# Patient Record
Sex: Male | Born: 1965
Health system: Southern US, Community
[De-identification: ages and names within clinical notes are randomized; demographics above are authoritative.]

## PROBLEM LIST (undated history)

## (undated) DIAGNOSIS — E785 Hyperlipidemia, unspecified: Secondary | ICD-10-CM

## (undated) DIAGNOSIS — L4050 Arthropathic psoriasis, unspecified: Secondary | ICD-10-CM

## (undated) DIAGNOSIS — I214 Non-ST elevation (NSTEMI) myocardial infarction: Secondary | ICD-10-CM

## (undated) DIAGNOSIS — M539 Dorsopathy, unspecified: Secondary | ICD-10-CM

## (undated) DIAGNOSIS — L409 Psoriasis, unspecified: Secondary | ICD-10-CM

## (undated) DIAGNOSIS — F419 Anxiety disorder, unspecified: Secondary | ICD-10-CM

## (undated) DIAGNOSIS — I10 Essential (primary) hypertension: Secondary | ICD-10-CM

## (undated) HISTORY — DX: Anxiety disorder, unspecified: F41.9

## (undated) HISTORY — PX: APPENDECTOMY: SHX54

## (undated) HISTORY — DX: Psoriasis, unspecified: L40.9

## (undated) HISTORY — DX: Essential (primary) hypertension: I10

## (undated) HISTORY — DX: Hyperlipidemia, unspecified: E78.5

## (undated) HISTORY — PX: OTHER SURGICAL HISTORY: SHX169

## (undated) HISTORY — DX: Non-ST elevation (NSTEMI) myocardial infarction: I21.4

## (undated) HISTORY — DX: Arthropathic psoriasis, unspecified: L40.50

## (undated) HISTORY — PX: RETINAL DETACHMENT SURGERY: SHX105

## (undated) HISTORY — DX: Dorsopathy, unspecified: M53.9

---

## 1999-12-13 ENCOUNTER — Encounter: Admission: RE | Admit: 1999-12-13 | Discharge: 1999-12-13 | Payer: Self-pay | Admitting: Orthopedic Surgery

## 1999-12-13 ENCOUNTER — Encounter: Payer: Self-pay | Admitting: Orthopedic Surgery

## 2000-07-25 ENCOUNTER — Emergency Department (HOSPITAL_COMMUNITY): Admission: EM | Admit: 2000-07-25 | Discharge: 2000-07-25 | Payer: Self-pay | Admitting: Emergency Medicine

## 2000-07-26 ENCOUNTER — Inpatient Hospital Stay (HOSPITAL_COMMUNITY): Admission: EM | Admit: 2000-07-26 | Discharge: 2000-07-28 | Payer: Self-pay | Admitting: Emergency Medicine

## 2000-07-26 ENCOUNTER — Encounter: Payer: Self-pay | Admitting: General Surgery

## 2000-07-30 ENCOUNTER — Encounter: Payer: Self-pay | Admitting: Emergency Medicine

## 2000-07-30 ENCOUNTER — Emergency Department (HOSPITAL_COMMUNITY): Admission: EM | Admit: 2000-07-30 | Discharge: 2000-07-30 | Payer: Self-pay | Admitting: Emergency Medicine

## 2000-10-27 ENCOUNTER — Emergency Department (HOSPITAL_COMMUNITY): Admission: EM | Admit: 2000-10-27 | Discharge: 2000-10-27 | Payer: Self-pay | Admitting: Emergency Medicine

## 2003-08-16 ENCOUNTER — Emergency Department (HOSPITAL_COMMUNITY): Admission: AC | Admit: 2003-08-16 | Discharge: 2003-08-17 | Payer: Self-pay

## 2003-11-06 ENCOUNTER — Encounter: Admission: RE | Admit: 2003-11-06 | Discharge: 2003-11-06 | Payer: Self-pay | Admitting: Orthopedic Surgery

## 2004-02-01 ENCOUNTER — Inpatient Hospital Stay (HOSPITAL_COMMUNITY): Admission: RE | Admit: 2004-02-01 | Discharge: 2004-02-04 | Payer: Self-pay | Admitting: Orthopaedic Surgery

## 2005-07-17 ENCOUNTER — Ambulatory Visit: Payer: Self-pay | Admitting: Cardiovascular Disease

## 2005-07-18 ENCOUNTER — Emergency Department (HOSPITAL_COMMUNITY): Admission: EM | Admit: 2005-07-18 | Discharge: 2005-07-18 | Payer: Self-pay | Admitting: Emergency Medicine

## 2005-07-24 ENCOUNTER — Encounter: Payer: Self-pay | Admitting: Cardiology

## 2005-07-24 ENCOUNTER — Ambulatory Visit: Payer: Self-pay

## 2005-07-24 ENCOUNTER — Ambulatory Visit: Payer: Self-pay | Admitting: *Deleted

## 2006-02-05 ENCOUNTER — Emergency Department (HOSPITAL_COMMUNITY): Admission: EM | Admit: 2006-02-05 | Discharge: 2006-02-05 | Payer: Self-pay | Admitting: Emergency Medicine

## 2009-08-11 DIAGNOSIS — I214 Non-ST elevation (NSTEMI) myocardial infarction: Secondary | ICD-10-CM

## 2009-08-11 HISTORY — DX: Non-ST elevation (NSTEMI) myocardial infarction: I21.4

## 2009-09-04 ENCOUNTER — Inpatient Hospital Stay (HOSPITAL_COMMUNITY): Admission: EM | Admit: 2009-09-04 | Discharge: 2009-09-06 | Payer: Self-pay | Admitting: Emergency Medicine

## 2009-09-04 ENCOUNTER — Ambulatory Visit: Payer: Self-pay | Admitting: Cardiology

## 2009-09-10 DIAGNOSIS — I214 Non-ST elevation (NSTEMI) myocardial infarction: Secondary | ICD-10-CM

## 2009-09-10 DIAGNOSIS — R079 Chest pain, unspecified: Secondary | ICD-10-CM | POA: Insufficient documentation

## 2009-09-10 DIAGNOSIS — I10 Essential (primary) hypertension: Secondary | ICD-10-CM | POA: Insufficient documentation

## 2009-09-10 DIAGNOSIS — Z87442 Personal history of urinary calculi: Secondary | ICD-10-CM | POA: Insufficient documentation

## 2009-09-10 HISTORY — DX: Non-ST elevation (NSTEMI) myocardial infarction: I21.4

## 2009-09-13 ENCOUNTER — Encounter: Payer: Self-pay | Admitting: Cardiology

## 2009-09-17 ENCOUNTER — Encounter: Payer: Self-pay | Admitting: Cardiovascular Disease

## 2009-09-21 ENCOUNTER — Ambulatory Visit: Payer: Self-pay | Admitting: Cardiovascular Disease

## 2009-09-21 DIAGNOSIS — I2510 Atherosclerotic heart disease of native coronary artery without angina pectoris: Secondary | ICD-10-CM | POA: Insufficient documentation

## 2009-10-12 ENCOUNTER — Telehealth: Payer: Self-pay | Admitting: Cardiovascular Disease

## 2009-11-08 ENCOUNTER — Telehealth: Payer: Self-pay | Admitting: Cardiovascular Disease

## 2009-11-21 ENCOUNTER — Ambulatory Visit: Payer: Self-pay | Admitting: Cardiology

## 2009-11-21 ENCOUNTER — Ambulatory Visit (HOSPITAL_COMMUNITY): Admission: RE | Admit: 2009-11-21 | Discharge: 2009-11-21 | Payer: Self-pay | Admitting: Cardiovascular Disease

## 2009-11-21 ENCOUNTER — Encounter: Payer: Self-pay | Admitting: Cardiovascular Disease

## 2009-11-21 ENCOUNTER — Ambulatory Visit: Payer: Self-pay

## 2009-11-21 ENCOUNTER — Ambulatory Visit: Payer: Self-pay | Admitting: Cardiovascular Disease

## 2009-12-07 LAB — CONVERTED CEMR LAB
ALT: 31 units/L (ref 0–53)
Albumin: 4.1 g/dL (ref 3.5–5.2)
Bilirubin, Direct: 0 mg/dL (ref 0.0–0.3)
Cholesterol: 143 mg/dL (ref 0–200)
Direct LDL: 72.5 mg/dL
HDL: 43.5 mg/dL (ref >39.00)
Total Protein: 8.3 g/dL (ref 6.0–8.3)
Triglycerides: 230 mg/dL — ABNORMAL HIGH (ref 0.0–149.0)

## 2010-01-08 ENCOUNTER — Ambulatory Visit: Payer: Self-pay | Admitting: Cardiovascular Disease

## 2010-04-04 ENCOUNTER — Telehealth: Payer: Self-pay | Admitting: Cardiovascular Disease

## 2010-04-11 ENCOUNTER — Encounter: Payer: Self-pay | Admitting: Cardiovascular Disease

## 2010-08-09 ENCOUNTER — Ambulatory Visit: Payer: Self-pay | Admitting: Cardiovascular Disease

## 2010-08-09 ENCOUNTER — Encounter: Payer: Self-pay | Admitting: Cardiovascular Disease

## 2010-08-26 ENCOUNTER — Telehealth: Payer: Self-pay | Admitting: Cardiovascular Disease

## 2010-08-31 ENCOUNTER — Encounter: Payer: Self-pay | Admitting: Cardiovascular Disease

## 2010-09-10 NOTE — Progress Notes (Signed)
Summary: HEART RACING DUE TO MEDICATION NO( CHEST PAINS)  Phone Note Call from Patient Call back at [phone]   Caller: Patient Summary of Call: PT HEART RACING THINKS IT IS FROM MEDICATION (CRESTOR AND EFFIENT) Initial call taken by: Judie Grieve,  November 08, 2009 8:10 AM  Follow-up for Phone Call        Spoke with pt who reports daily episodes of heart beat pounding, feeling "edgy" and feels like he is having a "bad panic attack".  Pt able to exercise without difficulty and gets heart rate up to 140's with exercise.  No chest pain or SOB. States blood pressure and heart rate are fine at rest.  Taking all regularly prescribed medicines and takes Xanax infrequently.  He states he does drink coffee.  He reports alot of stress since death of brother last year. I told pt that symptoms do not sound as if they are coming from medicines and could possibly be anxiety related. I also encouraged him to decrease caffeine intake.  Pt expressed interest in starting cardiac rehab program so his exercise regimen would be monitored.  Will discuss with Dr. Clifton James. Dossie Arbour, RN, BSN  November 08, 2009 10:25 AM' Dr. Clifton James would like for pt to wear 48 hr holter monitor. Tried to call pt but no answer on number given and no voice mail. Home number listed is not in service. Will continue to try and reach him Dossie Arbour, RN, BSN  November 08, 2009 4:12 PM No answer on phone number given.  Left message at work number to have him call our office Dossie Arbour, RN, BSN  November 08, 2009 4:55 PM   Additional Follow-up for Phone Call Additional follow up Details #1::        pt returning call, Migdalia Dk  November 08, 2009 5:01 PM  Spoke with pt. He will come in for 48 hour holter monitor on 11/09/09 at 12:15 and follow up appt with Dr. Clifton James on 11/27/09 at 2:15 Additional Follow-up by: Dossie Arbour, RN, BSN,  November 08, 2009 5:23 PM

## 2010-09-10 NOTE — Progress Notes (Signed)
Summary: pt's meds making him feel funny  Phone Note Call from Patient Call back at Home Phone [phone]   Caller: Patient Reason for Call: Talk to Nurse, Talk to Doctor Summary of Call: pt was put on crestor and effient and he feels very antsy since he has been taking this and wants to talk to someone about it Initial call taken by: Omer Jack,  October 12, 2009 10:52 AM  Follow-up for Phone Call        Spoke with pt. Pt states he has been very anxious like feeling, restles like if he has drink can't get  when drinks coffee. Pt. denies drinking coffee. He would like to know if those symptoms are of Crestor of Effient side effects?.  Pt. has taken these meds for a month. After re-cheking the PDR I let pt. now those symptoms are not listed as those medications SE. RN advise pt to call his PCP for an appointment. Pt. Verbalized understanding. Follow-up by: Ollen Gross, RN, BSN,  October 12, 2009 12:52 PM

## 2010-09-10 NOTE — Assessment & Plan Note (Signed)
Summary: eph/jml   Visit Type:  EPH Primary Provider:  Louanna Raw  CC:  no cardiac complaints today.  History of Present Illness: 45 yo WM with history of HTN, recently diagnosed CAD at time of presentation with NSTEMi at [facility] 09/05/09. Cath revealed severe stenosis mid LAD, moderate stenosis RCA and OM3. 2 drug eluting stents were  placed in the mid LAD in an overlapping fashion. His EF was normal at time of cath. He did well following the cath and was discharged to home in stable condition. He is here today for follow up. He has returned to exercising daily and has had no chest pain or dyspnea. His cath site is well healed. He has had no pain or swelling at the cath site. He has reduced his atenolol to 25 mg per day because of mild fatigue and feels better on the reduced dose.   Current Medications (verified): 1)  Nitrostat 0.4 Mg Subl (Nitroglycerin) .Marland Kitchen.. 1 Tablet Under Tongue At Onset of Chest Pain; You May Repeat Every 5 Minutes For Up To 3 Doses. 2)  Crestor 40 Mg Tabs (Rosuvastatin Calcium) .Marland Kitchen.. 1 Tab  At Bedtime 3)  Aspirin Ec 325 Mg Tbec (Aspirin) .... Take One Tablet By Mouth Daily 4)  Effient 10 Mg Tabs (Prasugrel Hcl) .Marland Kitchen.. 1 Tab Once Daily 5)  Atenolol 50 Mg Tabs (Atenolol) .... 1/2 Tab Once Daily 6)  Alprazolam 0.5 Mg Tabs (Alprazolam) .Marland Kitchen.. 1 Tab Three Times A Day  Allergies: 1)  ! * Meperidine  Past History:  Past Medical History: NSTEMI January 2011 with 2 overlapping drug eluting stents mid LAD HYPERTENSION (ICD-401.9) NEPHROLITHIASIS, HX OF (ICD-V13.01) Chronic back problems Anxiety  Past Surgical History:  L5-S1 disk replacement Appendectomy Retinal detachment at age 69 (traumatic)  Family History: Mother died at age 80.  She had an MI in her 58s and   also has a history of hypertension with COPD. Father died at 45 and had an MI in his either late 60s or 65s.  He had a history hypertension and  COPD.  He has total 4 brothers.  One is a  recovering drug abuser.  The   other three have all had MIs in their late 30s or early 17s.  One died   at age 28 following CABG.  Twin sister with late stage lupus  Social History: The patient lives in [place] by himself. Divorced. Single He works a [employer].   He has never smoked.  He drinks 12  packs of beer week.   Denies drug use.   He exercises 5-7 days a week in  the gym and tries to adhere to a high-carb/high-protein diet.      Review of Systems  The patient denies fatigue, malaise, fever, weight gain/loss, vision loss, decreased hearing, hoarseness, chest pain, palpitations, shortness of breath, prolonged cough, wheezing, sleep apnea, coughing up blood, abdominal pain, blood in stool, nausea, vomiting, diarrhea, heartburn, incontinence, blood in urine, muscle weakness, joint pain, leg swelling, rash, skin lesions, headache, fainting, dizziness, depression, anxiety, enlarged lymph nodes, easy bruising or bleeding, and environmental allergies.    Vital Signs:  Patient profile:   45 year old male Height:      71 inches Weight:      243 pounds BMI:     34.01 Pulse rate:   66 / minute Pulse rhythm:   irregular BP sitting:   120 / 80  (left arm) Cuff size:   large  Vitals  Entered By: Danielle Rankin, CMA (September 21, 2009 10:54 AM)  Physical Exam  General:  General: Well developed, well nourished, NAD HEENT: OP clear, mucus membranes moist SKIN: warm, dry Neuro: No focal deficits Musculoskeletal: Muscle strength 5/5 all ext Psychiatric: Mood and affect normal Neck: No JVD, no carotid bruits, no thyromegaly, no lymphadenopathy. Lungs:Clear bilaterally, no wheezes, rhonci, crackles CV: RRR no murmurs, gallops rubs Abdomen: soft, NT, ND, BS present Extremities: No edema, pulses 2+.    Cardiac Cath  Procedure date:  09/05/2009  Findings:       1. The left main coronary artery was a very short segment that had no       disease.   2. The left  anterior descending was a large vessel that coursed to the       apex and gave off a moderate-sized first diagonal branch.  The LAD       had mild plaque disease in the proximal and distal portion.  The       midvessel just at the takeoff of the diagonal branch had a 65-75%       stenosis.  Just beyond this in the midvessel, there was a 99%       severe stenosis.  The first diagonal branch had an ostial 30%       stenosis.  This was a bifurcating branch.   3. Circumflex artery was a large caliber vessel that gave off two very       small obtuse marginal branches followed by a moderate-sized third       obtuse marginal branch that had a long tubular 50% stenosis in the       proximal portion.  The fourth obtuse marginal branch had mild       plaque disease.  The left-sided PDA had plaque disease.   4. The right coronary artery was a small-to-moderate-sized nondominant       vessel with serial 40-50% lesions in the proximal and midportion.   5. Left ventricular angiogram was performed in the RAO projection and       showed normal left ventricular systolic function with ejection       fraction of 55%.  There was a small area at the apex that appeared       to be mildly hypokinetic.      IMPRESSION:   1. Single-vessel coronary artery disease.   2. Moderately nonobstructive disease in the circumflex and right       coronary artery.   3. Normal left ventricular systolic function.   4. Successful percutaneous coronary intervention with placement of two       drug-eluting stents in an overlapping fashion in the mid left       anterior descending artery.   EKG  Procedure date:  09/21/2009  Findings:      NSR, rate 66 bpm. Poor R wave progression through precordial leads.   Impression & Recommendations:  Problem # 1:  CAD, NATIVE VESSEL (ICD-414.01) Stable post cath/stents. Continue ASA and Effient for one year. Continue low dose beta blocker and statin. I have encouraged continued  exercise and appropriate diet. He is a non smoker, lifelong.  Echo 10 weeks. Check LFTs and Lipids 10 weeks.   His updated medication list for this problem includes:    Nitrostat 0.4 Mg Subl (Nitroglycerin) .Marland Kitchen... 1 tablet under tongue at onset of chest pain; you may repeat every 5 minutes for up to 3 doses.    Aspirin  Ec 325 Mg Tbec (Aspirin) .Marland Kitchen... Take one tablet by mouth daily    Effient 10 Mg Tabs (Prasugrel hcl) .Marland Kitchen... 1 tab once daily    Atenolol 50 Mg Tabs (Atenolol) .Marland Kitchen... 1/2 tab once daily  Problem # 2:  HYPERTENSION (ICD-401.9) Well controlled on current therapy.   His updated medication list for this problem includes:    Aspirin Ec 325 Mg Tbec (Aspirin) .Marland Kitchen... Take one tablet by mouth daily    Atenolol 50 Mg Tabs (Atenolol) .Marland Kitchen... 1/2 tab once daily  Orders: Echocardiogram (Echo)  Other Orders: EKG w/ Interpretation (93000)  Patient Instructions: 1)  Your physician recommends that you schedule a follow-up appointment in: 6 months 2)  Your physician recommends that you return for a FASTING lipid profile and hepatic in 10 weeks -414.01,272.0   3)  Your physician has requested that you have an echocardiogram.  Echocardiography is a painless test that uses sound waves to create images of your heart. It provides your doctor with information about the size and shape of your heart and how well your heart's chambers and valves are working.  This procedure takes approximately one hour. There are no restrictions for this procedure.  To be done in 10 weeks

## 2010-09-10 NOTE — Procedures (Signed)
Summary: summary report  summary report   Imported By: Mirna Mires 11/22/2009 16:21:14  _____________________________________________________________________  External Attachment:    Type:   Image     Comment:   External Document  Appended Document: summary report pt. notified

## 2010-09-10 NOTE — Letter (Signed)
Summary: Generic Letter  [facility]     [place], [place]    Phone:   Fax:         April 11, 2010  [place] [address] [place], [place]  [address]  Dear John Esparza, We have been trying to contact you regarding a question you had called our office about. If you could please contact us if we could be of any further assistance to you at [phone]    Sincerely,  Ellender Hose RN  This letter has been electronically signed by your physician.

## 2010-09-10 NOTE — Assessment & Plan Note (Signed)
Summary: rov per pt call/lg   Visit Type:  rov Primary Provider:  Louanna Raw  CC:  no energy...joint pain.  History of Present Illness: 45 yo WM with history of HTN, recently diagnosed CAD at time of presentation with NSTEMi at [name] 09/05/09. Cath revealed severe stenosis mid LAD, moderate stenosis RCA and OM3. 2 drug eluting stents were  placed in the mid LAD in an overlapping fashion. His EF was normal at time of cath. He did well following the cath and was discharged to home in stable condition. He is here today for follow up. Prior to the last visit, he reduced his atenolol dose because of fatigue. Since I saw him, he has  had some palpitations. Holter monitor was ordered but he only wore it for about 30 minutes. He had some PVCs in that brief period. An echo showed normal LV size and function. He has been working out and having no chest pain. HIs breathing has been ok. Occasional right sided chest pains that last for a few seconds. He has been overall fatigued since being on the beta blocker. He has been lifting weights every day without limitation. He does have mild pain in both upper arms/elbows when lifting. He had not had this pain before he started the statin.   Current Medications (verified): 1)  Nitrostat 0.4 Mg Subl (Nitroglycerin) .Marland Kitchen.. 1 Tablet Under Tongue At Onset of Chest Pain; You May Repeat Every 5 Minutes For Up To 3 Doses. 2)  Crestor 40 Mg Tabs (Rosuvastatin Calcium) .Marland Kitchen.. 1 Tab  At Bedtime 3)  Effient 10 Mg Tabs (Prasugrel Hcl) .Marland Kitchen.. 1 Tab Once Daily 4)  Atenolol 50 Mg Tabs (Atenolol) .... 1/2 Tab Once Daily  Allergies: 1)  ! * Meperidine  Past History:  Past Medical History: Reviewed history from 09/21/2009 and no changes required. NSTEMI January 2011 with 2 overlapping drug eluting stents mid LAD HYPERTENSION (ICD-401.9) NEPHROLITHIASIS, HX OF (ICD-V13.01) Chronic back problems Anxiety  Social History: Reviewed history from 09/21/2009 and no  changes required. The patient lives in [place] by himself. Divorced. Single He works a [employer].   He has never smoked.  He drinks 12  packs of beer week.   Denies drug use.   He exercises 5-7 days a week in  the gym and tries to adhere to a high-carb/high-protein diet.      Review of Systems       The patient complains of fatigue, chest pain, and joint pain.  The patient denies malaise, fever, weight gain/loss, vision loss, decreased hearing, hoarseness, palpitations, shortness of breath, prolonged cough, wheezing, sleep apnea, coughing up blood, abdominal pain, blood in stool, nausea, vomiting, diarrhea, heartburn, incontinence, blood in urine, muscle weakness, leg swelling, rash, skin lesions, headache, fainting, dizziness, depression, anxiety, enlarged lymph nodes, easy bruising or bleeding, and environmental allergies.    Vital Signs:  Patient profile:   45 year old male Height:      71 inches Weight:      250 pounds BMI:     34.99 Pulse rate:   70 / minute Pulse rhythm:   regular BP sitting:   120 / 80  (left arm) Cuff size:   large  Vitals Entered By: Danielle Rankin, CMA (Jan 08, 2010 11:59 AM)  Physical Exam  General:  General: Well developed, well nourished, NAD Musculoskeletal: Muscle strength 5/5 all ext Psychiatric: Mood and affect normal Neck: No JVD, no carotid bruits, no thyromegaly, no lymphadenopathy. Lungs:Clear bilaterally,  no wheezes, rhonci, crackles CV: RRR no murmurs, gallops rubs Abdomen: soft, NT, ND, BS present Extremities: No edema, pulses 2+.    Echocardiogram  Procedure date:  11/21/2009  Findings:      Left ventricle: The cavity size was normal. Wall thickness was       increased in a pattern of mild LVH. Systolic function was normal.       The estimated ejection fraction was in the range of 55% to 60%.       Wall motion was normal; there were no regional wall motion       abnormalities. Left ventricular diastolic  function parameters were       normal.     - Pulmonary arteries: PA peak pressure: 34mm Hg (S).         Holter Monitor  Procedure date:  11/21/2009  Findings:      PVCs. NSR.   Impression & Recommendations:  Problem # 1:  CAD, NATIVE VESSEL (ICD-414.01) Stable. Will stop beta blocker secondary to fatigue. Continue ASA and Plavix. continue statin. will check CK level today with muscle aches.   The following medications were removed from the medication list:    Aspirin Ec 325 Mg Tbec (Aspirin) .Marland Kitchen... Take one tablet by mouth daily    Atenolol 50 Mg Tabs (Atenolol) .Marland Kitchen... 1/2 tab once daily His updated medication list for this problem includes:    Nitrostat 0.4 Mg Subl (Nitroglycerin) .Marland Kitchen... 1 tablet under tongue at onset of chest pain; you may repeat every 5 minutes for up to 3 doses.    Effient 10 Mg Tabs (Prasugrel hcl) .Marland Kitchen... 1 tab once daily    Aspirin 81 Mg Tbec (Aspirin) .Marland Kitchen... Take one tablet by mouth daily  Orders: TLB-CK Total Only(Creatine Kinase/CPK) (82550-CK)  Problem # 2:  HYPERTENSION (ICD-401.9) BP well controlled. Will reassess off of beta blocker.   The following medications were removed from the medication list:    Aspirin Ec 325 Mg Tbec (Aspirin) .Marland Kitchen... Take one tablet by mouth daily    Atenolol 50 Mg Tabs (Atenolol) .Marland Kitchen... 1/2 tab once daily His updated medication list for this problem includes:    Aspirin 81 Mg Tbec (Aspirin) .Marland Kitchen... Take one tablet by mouth daily  Patient Instructions: 1)  Your physician recommends that you schedule a follow-up appointment in: 6 months 2)  Your physician has recommended you make the following change in your medication: Start enteric coated aspirin 81 mg by mouth daily. 3)  Stop atenolol

## 2010-09-10 NOTE — Miscellaneous (Signed)
Summary: [facility]iac Progress Note  [facility] Progress Note   Imported By: Roderic Ovens 09/19/2009 12:23:20  _____________________________________________________________________  External Attachment:    Type:   Image     Comment:   External Document

## 2010-09-10 NOTE — Progress Notes (Signed)
Summary: Need a referral to a therapist-***no voice mail set up***  Phone Note Call from Patient Call back at Home Phone [phone]   Caller: Patient Summary of Call: Pt have question about getting referral to go to a therapist Initial call taken by: Judie Grieve,  April 04, 2010 2:00 PM  Follow-up for Phone Call        attempted to contact pt at [phone]--voice mailbox not set up yet Katina Dung, RN, BSN  April 04, 2010 2:53 PM  Attempted to contact pt. --Message received that voicemail has not been set up Dossie Arbour, RN, BSN  April 08, 2010 4:11 PM  Tried to call pt. back and voicemail has still not been set up. Will send a letter to try and further contact pt. Whitney Maeola Sarah RN  April 11, 2010 3:07 PM   Additional Follow-up for Phone Call Additional follow up Details #1::        I guess we can try him a few more times to see what he needs. Additional Follow-up by: Verne Carrow, MD,  April 11, 2010 1:27 PM

## 2010-09-10 NOTE — Miscellaneous (Signed)
Summary: [facility] Phys[facility] Order/Treatment Plan  [facility] Order/Treatment Plan   Imported By: Roderic Ovens 09/26/2009 10:54:11  _____________________________________________________________________  External Attachment:    Type:   Image     Comment:   External Document

## 2010-09-12 NOTE — Assessment & Plan Note (Signed)
Summary: E4V   Visit Type:  6 month follow up Primary Icess Bertoni:  Louanna Raw  CC:  no complaints.  History of Present Illness: 45 yo WM with history of HTN,  CAD at time of presentation with NSTEMI at [facility] 09/05/09. Cath revealed severe stenosis mid LAD, moderate stenosis RCA and OM3. 2 drug eluting stents were  placed in the mid LAD in an overlapping fashion. His EF was normal at time of cath.    He is here today for follow up. He has restarted his atenolol. Earlier this summer he had some palpitations. Holter monitor was ordered but he only wore it for about 30 minutes. He had some PVCs in that brief period. An echo showed normal LV size and function. He has been working out and having no chest pain. His breathing has been ok. He has been riding his bike with no chest pain.   Current Medications (verified): 1)  Nitrostat 0.4 Mg Subl (Nitroglycerin) .Marland Kitchen.. 1 Tablet Under Tongue At Onset of Chest Pain; You May Repeat Every 5 Minutes For Up To 3 Doses. 2)  Crestor 40 Mg Tabs (Rosuvastatin Calcium) .... 1/2  Tab At Bedtime 3)  Effient 10 Mg Tabs (Prasugrel Hcl) .Marland Kitchen.. 1 Tab Once Daily 4)  Aspirin 81 Mg Tbec (Aspirin) .... Take One Tablet By Mouth Daily 5)  Atenolol 50 Mg Tabs (Atenolol) .... Take One Half Tablet  By Mouth Daily  Allergies (verified): 1)  ! * Meperidine  Past History:  Past Medical History: Reviewed history from 09/21/2009 and no changes required. NSTEMI January 2011 with 2 overlapping drug eluting stents mid LAD HYPERTENSION (ICD-401.9) NEPHROLITHIASIS, HX OF (ICD-V13.01) Chronic back problems Anxiety  Social History: Reviewed history from 09/21/2009 and no changes required. The patient lives in [place] by himself. Divorced. Single He works a [employer].   He has never smoked.  He drinks 12  packs of beer week.   Denies drug use.   He exercises 5-7 days a week in  the gym and tries to adhere to a high-carb/high-protein diet.      Review of Systems  The patient denies fatigue, malaise, fever, weight gain/loss, vision loss, decreased hearing, hoarseness, chest pain, palpitations, shortness of breath, prolonged cough, wheezing, sleep apnea, coughing up blood, abdominal pain, blood in stool, nausea, vomiting, diarrhea, heartburn, incontinence, blood in urine, muscle weakness, joint pain, leg swelling, rash, skin lesions, headache, fainting, dizziness, depression, anxiety, enlarged lymph nodes, easy bruising or bleeding, and environmental allergies.    Vital Signs:  Patient profile:   45 year old male Height:      71 inches Weight:      252.50 pounds BMI:     35.34 Pulse rate:   68 / minute BP sitting:   128 / 88  (left arm) Cuff size:   large  Vitals Entered By: Caralee Ates CMA (August 09, 2010 3:33 PM)  Physical Exam  General:  General: Well developed, well nourished, NAD HEENT: OP clear, mucus membranes moist SKIN: warm, dry Neuro: No focal deficits Musculoskeletal: Muscle strength 5/5 all ext Psychiatric: Mood and affect normal Neck: No JVD, no carotid bruits, no thyromegaly, no lymphadenopathy. Lungs:Clear bilaterally, no wheezes, rhonci, crackles CV: RRR no murmurs, gallops rubs Abdomen: soft, NT, ND, BS present Extremities: No edema, pulses 2+.    EKG  Procedure date:  08/09/2010  Findings:      NSR, rate 68 bpm. Normal EKG.   Impression & Recommendations:  Problem # 1:  CAD, NATIVE VESSEL (ICD-414.01) Stable. No changes in medications. Continue statin, beta blocker, ASA and Effient.   His updated medication list for this problem includes:    Nitrostat 0.4 Mg Subl (Nitroglycerin) .Marland Kitchen... 1 tablet under tongue at onset of chest pain; you may repeat every 5 minutes for up to 3 doses.    Effient 10 Mg Tabs (Prasugrel hcl) .Marland Kitchen... 1 tab once daily    Aspirin 81 Mg Tbec (Aspirin) .Marland Kitchen... Take one tablet by mouth daily    Atenolol 50 Mg Tabs (Atenolol) .Marland Kitchen... Take one half tablet  by mouth  daily  Orders: EKG w/ Interpretation (93000)  Problem # 2:  HYPERTENSION (ICD-401.9) BP well controlled on current therapy.   His updated medication list for this problem includes:    Aspirin 81 Mg Tbec (Aspirin) .Marland Kitchen... Take one tablet by mouth daily    Atenolol 50 Mg Tabs (Atenolol) .Marland Kitchen... Take one half tablet  by mouth daily  Patient Instructions: 1)  Your physician recommends that you schedule a follow-up appointment in: 6 months with Dr. Clifton James 2)  Your physician recommends that you continue on your current medications as directed. Please refer to the Current Medication list given to you today. Prescriptions: EFFIENT 10 MG TABS (PRASUGREL HCL) 1 tab once daily  #90 x 03   Entered by:   Lisabeth Devoid RN   Authorized by:   Verne Carrow, MD   Signed by:   Lisabeth Devoid RN on 08/09/2010   Method used:   Electronically to        [facility]  [phone]* (retail)       [address]       [place], [place]  [address]       Ph: [phone] or [phone]       Fax: [phone]   RxID:   [id number] CRESTOR 40 MG TABS (ROSUVASTATIN CALCIUM) 1 tab  at bedtime  #90 Each x 3   Entered by:   Lisabeth Devoid RN   Authorized by:   Verne Carrow, MD   Signed by:   Lisabeth Devoid RN on 08/09/2010   Method used:   Electronically to        [facility]  [phone]* (retail)       [address]       [place], [place]  [address]       Ph: [phone] or [phone]       Fax: [phone]   RxID:   [id number]

## 2010-09-12 NOTE — Progress Notes (Signed)
Summary: rx NTG  Phone Note Refill Request Message from:  Patient on August 26, 2010 11:08 AM  Refills Requested: Medication #1:  NITROSTAT 0.4 MG SUBL 1 tablet under tongue at onset of chest pain; you may repeat every 5 minutes for up to 3 doses.  Method Requested: Telephone to Pharmacy Initial call taken by: Roe Coombs,  August 26, 2010 11:08 AM    Prescriptions: NITROSTAT 0.4 MG SUBL (NITROGLYCERIN) 1 tablet under tongue at onset of chest pain; you may repeat every 5 minutes for up to 3 doses.  #25 x 6   Entered by:   Danielle Rankin, CMA   Authorized by:   Verne Carrow, MD   Signed by:   Danielle Rankin, CMA on 08/26/2010   Method used:   Electronically to        [facility]  [phone]* (retail)       [address]       [place], [place]  [address]       Ph: [phone] or [phone]       Fax: [phone]   RxID:   [id number]

## 2010-10-27 LAB — POCT CARDIAC MARKERS
CKMB, poc: 3.9 ng/mL (ref 1.0–8.0)
CKMB, poc: 5.4 ng/mL (ref 1.0–8.0)
Troponin i, poc: 0.08 ng/mL (ref 0.00–0.09)
Troponin i, poc: 0.16 ng/mL — ABNORMAL HIGH (ref 0.00–0.09)
Troponin i, poc: <0.05 ng/mL (ref 0.00–0.09)

## 2010-10-27 LAB — COMPREHENSIVE METABOLIC PANEL
Albumin: 3.4 g/dL — ABNORMAL LOW (ref 3.5–5.2)
BUN: 11 mg/dL (ref 6–23)
Calcium: 8.8 mg/dL (ref 8.4–10.5)
Chloride: 102 mEq/L (ref 96–112)
Creatinine, Ser: 1.06 mg/dL (ref 0.4–1.5)
GFR calc Af Amer: >60 mL/min (ref >60)
Total Bilirubin: 0.7 mg/dL (ref 0.3–1.2)

## 2010-10-27 LAB — CBC
HCT: 44.6 % (ref 39.0–52.0)
HCT: 45.2 % (ref 39.0–52.0)
Hemoglobin: 14.8 g/dL (ref 13.0–17.0)
Hemoglobin: 15.9 g/dL (ref 13.0–17.0)
MCHC: 35 g/dL (ref 30.0–36.0)
MCV: 91.7 fL (ref 78.0–100.0)
MCV: 92.6 fL (ref 78.0–100.0)
Platelets: 212 10*3/uL (ref 150–400)
Platelets: 218 10*3/uL (ref 150–400)
RBC: 4.59 MIL/uL (ref 4.22–5.81)
RBC: 4.81 MIL/uL (ref 4.22–5.81)
RDW: 12.8 % (ref 11.5–15.5)
RDW: 12.8 % (ref 11.5–15.5)
WBC: 9.1 10*3/uL (ref 4.0–10.5)
WBC: 9.6 10*3/uL (ref 4.0–10.5)
WBC: 9.8 10*3/uL (ref 4.0–10.5)

## 2010-10-27 LAB — BASIC METABOLIC PANEL
BUN: 12 mg/dL (ref 6–23)
BUN: 13 mg/dL (ref 6–23)
CO2: 25 mEq/L (ref 19–32)
CO2: 30 mEq/L (ref 19–32)
Chloride: 103 mEq/L (ref 96–112)
Chloride: 104 mEq/L (ref 96–112)
GFR calc Af Amer: >60 mL/min (ref >60)
Glucose, Bld: 93 mg/dL (ref 70–99)
Potassium: 3.9 mEq/L (ref 3.5–5.1)
Potassium: 4 mEq/L (ref 3.5–5.1)
Sodium: 137 mEq/L (ref 135–145)

## 2010-10-27 LAB — DIFFERENTIAL
Basophils Absolute: 0.1 10*3/uL (ref 0.0–0.1)
Eosinophils Absolute: 0.2 10*3/uL (ref 0.0–0.7)
Eosinophils Relative: 3 % (ref 0–5)
Lymphocytes Relative: 37 % (ref 12–46)
Monocytes Absolute: 0.9 10*3/uL (ref 0.1–1.0)

## 2010-10-27 LAB — CK TOTAL AND CKMB (NOT AT ARMC)
Relative Index: 3.1 — ABNORMAL HIGH (ref 0.0–2.5)
Total CK: 405 U/L — ABNORMAL HIGH (ref 7–232)

## 2010-10-27 LAB — LIPID PANEL
HDL: 40 mg/dL (ref >39)
LDL Cholesterol: 143 mg/dL — ABNORMAL HIGH (ref 0–99)
Triglycerides: 181 mg/dL — ABNORMAL HIGH (ref <150)
VLDL: 36 mg/dL (ref 0–40)

## 2010-10-27 LAB — CARDIAC PANEL(CRET KIN+CKTOT+MB+TROPI)
CK, MB: 12.2 ng/mL (ref 0.3–4.0)
CK, MB: 7.1 ng/mL (ref 0.3–4.0)
Relative Index: 2.4 (ref 0.0–2.5)
Relative Index: 2.9 — ABNORMAL HIGH (ref 0.0–2.5)
Relative Index: 3.3 — ABNORMAL HIGH (ref 0.0–2.5)
Total CK: 243 U/L — ABNORMAL HIGH (ref 7–232)
Total CK: 365 U/L — ABNORMAL HIGH (ref 7–232)
Troponin I: 1.81 ng/mL (ref 0.00–0.06)
Troponin I: 2.73 ng/mL (ref 0.00–0.06)

## 2010-10-27 LAB — HEPARIN LEVEL (UNFRACTIONATED)
Heparin Unfractionated: 0.12 IU/mL — ABNORMAL LOW (ref 0.30–0.70)
Heparin Unfractionated: 0.19 IU/mL — ABNORMAL LOW (ref 0.30–0.70)

## 2010-10-27 LAB — TROPONIN I: Troponin I: 1.3 ng/mL (ref 0.00–0.06)

## 2010-10-27 LAB — TSH: TSH: 1.125 u[IU]/mL (ref 0.350–4.500)

## 2010-10-27 LAB — APTT: aPTT: 27 seconds (ref 24–37)

## 2010-12-27 NOTE — Op Note (Signed)
NAME:  John Esparza, HACKENBERG                           ACCOUNT NO.:  [id number]   MEDICAL RECORD NO.:  [id number]                   PATIENT TYPE:  INP   LOCATION:  6705                                 FACILITY:  [facility]   PHYSICIAN:  Adolph Pollack, M.D.            DATE OF BIRTH:  08-23-1965   DATE OF PROCEDURE:  02/01/2004  DATE OF DISCHARGE:  02/04/2004                                 OPERATIVE REPORT   PREOPERATIVE DIAGNOSIS:  L5-S1 degenerative disk disease with posterior  herniation.   POSTOPERATIVE DIAGNOSIS:  L5-S1 degenerative disk disease with posterior  herniation.   OPERATION PERFORMED:  Anterior preperitoneal approach to lumbosacral spine.   SURGEON:  Adolph Pollack, M.D.   ASSISTANT:  Velora Heckler, M.D.   ANESTHESIA:  General.   INDICATIONS FOR PROCEDURE:  This 45 year old male is a patient of Dr. Sharolyn Douglas who had an injury and work relating to the above diagnosis.  He has  tried conservative measures but continues to have problems with pain and now  presents for disk arthroplasty by way of an anterior approach.   DESCRIPTION OF PROCEDURE:  He was seen in the holding area, brought to the  operating room and placed supine on the operating table and a general  anesthetic was administered.  Foley catheter was placed in the bladder.  The  abdominal wall was clipped and sterilely prepped and draped.  A paramedian  or midline incision was made through the skin and subcutaneous tissue  and  anterior rectus sheath on the left.  The left rectus muscles were retracted  medially.  Using blunt dissection, extraperitoneal tissues were mobilized  medially including the ureter exposing the left iliac vein and artery.  I  then exposed the L5-S1 level and used careful dissection and cautery to  expose the L5-S1 disk space and L5 itself.  Once I had adequate exposure of  this, Dr. Noel Gerold proceeded with the L5-S1 disk arthroplasty and I helped him  with exposure on that.   After  the completion of Dr. Wells Guiles procedure, which he dictated in a  separate note, I then examined the area.  Hemostasis was noted to be  adequate.  The extraperitoneal structures were allowed to fall back down  into their normal position.  No vascular injury or ureteral injury was  noted.  Once the counts were confirmed, I then closed the anterior rectus  sheath with a running #1 PDS suture.  The subcutaneous tissue was irrigated  and the skin closed with staples.  I checked his pedal pulses on the left  side.  He had strong pulse present in both the dorsalis pedis and posterior  tibial region.  The patient tolerated the procedure without apparent  complications.  He subsequently was taken to recovery room in satisfactory  condition.  Adolph Pollack, M.D.    Kari Baars  D:  02/05/2004  T:  02/05/2004  Job:  [id number]

## 2010-12-27 NOTE — Discharge Summary (Signed)
NAME:  John Esparza, FOTI NO.:  [id number]   MEDICAL RECORD NO.:  [id number]          PATIENT TYPE:  INP   LOCATION:  1843                         FACILITY:  [facility]   PHYSICIAN:  Jesse Sans. Wall, M.D.   DATE OF BIRTH:  July 01, 1966   DATE OF ADMISSION:  07/18/2005  DATE OF DISCHARGE:  07/18/2005                                 DISCHARGE SUMMARY   DISCHARGE DIAGNOSIS:  Palpitations, point of care markers x 1 set of  negative cardiac enzymes with electrocardiogram showing normal sinus rhythm  with nonspecific ST-T wave changes, point of care enzymes negative except  for elevated myoglobin.   PAST MEDICAL HISTORY:  Chronic back problems.   Mr. Fayson was seen in [facility] Emergency Department by Dr. Maurine Cane on  the day of admission complaining of acute onset of fullness in his neck and  flushing of his face with some mild swelling around his lips with  palpitations and an episode of vomiting after arrival to the emergency room.  The patient lifts weights and has been taking a lot of vitamins, also had  drank a new type of beer for him on the previous evening.  His symptoms  actually began about an hour after drinking a beer.  Mr. Janes was treated  with Benadryl and Solu-Medrol, aspirin, and nitroglycerin in the emergency  room  with complete resolution of neck and facial flushing.  Mr. Mcdermid  denied any chest pain.  However, Mr. Biss has a very strong family history  for premature coronary artery disease with two brothers having an MI in  their late 69s, one of these brothers already having bypass surgery.  Initially, Mr. Rao was going to be admitted to rule out MI, however, he  expressed interest in being discharged home and follow up as an outpatient.  Dr. Daleen Squibb into speak with the patient and review patient's chart.  The  patient has had no further episodes of palpitations.  It has been arranged  for him to be discharged home, follow up at our office on Monday.   I  scheduled him for a gait and Myoview at 7:15 a.m. on Monday, December 11,  and a 2D echocardiogram at 12:15 secondary to Dr. Fabio Bering finding of a soft  systolic murmur.  In the meantime, the patient is to call 911 if he  experiences any further episodes of discomfort that brought him in.  He is  also to have Benadryl p.r.n. if needed.  We will also check a fasting lipid  panel Monday at our office.  Duration of discharge 20 minutes.      Dorian Pod, NP      Jesse Sans. Wall, M.D.  Electronically Signed    MB/MEDQ  D:  07/18/2005  T:  07/18/2005  Job:  [id number]   cc:   Charlton Haws, M.D.  [address]  [address]  [place]  [place] [address]

## 2010-12-27 NOTE — Discharge Summary (Signed)
NAME:  John Esparza, John Esparza NO.:  [id number]   MEDICAL RECORD NO.:  [id number]                   PATIENT TYPE:  INP   LOCATION:  6705                                 FACILITY:  [facility]   PHYSICIAN:  Sharolyn Douglas, M.D.                     DATE OF BIRTH:  [birth year]   DATE OF ADMISSION:  02/01/2004  DATE OF DISCHARGE:  02/04/2004                                 DISCHARGE SUMMARY   ADMISSION DIAGNOSES:  1. L5-S1 degenerative disk disease with chronic pain.  2. Hypertension.   DISCHARGE DIAGNOSES:  1. Status post L5-S1 disk replacement.  2. Postoperative fever resolved prior to discharge.  3. Hypertension.  4. Mild postoperative anemia.   CONSULTATIONS:  None.   PROCEDURE:  On February 01, 2004, the patient was taken to the operating room  for an L5-S1 disk replacement.  This was done by Sharolyn Douglas, M.D. with the  assistance of [facility].  Anterior exposure was obtained by Avel Peace, M.D. with the assistance of Darnell Level, M.D.  General  anesthesia was used.   LABORATORY DATA:  On January 26, 2004, CBC with differential was within normal  limits.  PT/INR and PTT was within normal limits.  Complete metabolic panel  within normal limits.  UA was negative.  Blood type __________ positive,  antibody screen negative.  CBC was monitored for 2 days postoperatively.  Did have elevation in his white count of 21.3 and 14.3.  H&H was monitored  throughout his hospital stay.  He did drop down to a low of 12.9 and 37.0  but he was asymptomatic and did not require any treatment.  On June 23rd  basic metabolic panel:  Sodium was 133, glucose 198, calcium 8.2, otherwise  normal.  The following day on June 24th sodium continued to be 133, chloride  94, glucose 121, otherwise normal.  EKG from June 17th shows normal sinus  rhythm read by Daisey Must, M.D.  X-ray was used intraoperatively for  positioning and localization.  On June 23rd, lumbar spine showed a  synthetic  disk replacement at L5-S1.   HOSPITAL COURSE:  The patient is a 45 year old male who was taken to the  operating room for above-mentioned procedure on February 01, 2004.  Tolerated  the procedure well without any intraoperative complications.  Approximately  400 mL of blood loss.  He was transferred to the recovery room in stable  condition.  Postoperatively a routine orthopedic spine protocol was followed  including prophylactic antibiotics, early mobilization, pain control using a  combination of PCA and p.o. analgesics.  Diet was slowly advanced  postoperatively.  Physical therapy and occupational therapy were consulted  to assist [place] with early mobilization and back precautions including  encouraging flexion and discouraging extension beyond neutral position.  During his hospital stay he did progress very well with physical therapy.  He  did not develop any postoperative complications.  He only had a low-grade  temperature which did resolve with mobilization and incentive spirometer  use.  General surgery, Dr. Abbey Chatters, continued to follow him with [place]  throughout his hospital stay.  By February 04, 2004, the patient had met all  orthopedic goals.  He was medically stable and ready for discharge.  He was  stable and ready for discharge from a general surgery standpoint as well.   DISCHARGE PLAN:  Includes diagnosis.  The patient is a 45 year old male  status post L5-S1 disk replacement doing well.  Activity is daily  ambulation.  We encourage flexion in sitting position. We discourage  extension beyond neutral.  Avoid any heavy lifting.  Avoid repetitive  bending, stooping, squatting.  Diet is regular home diet.   FOLLOW UP:  Two weeks postoperatively with Dr. Noel Gerold, also follow up with  Dr. Abbey Chatters.   CONDITION ON DISCHARGE:  Stable, improved.   DISPOSITION:  The patient is being discharged to his home with his family's  assistance.   DISCHARGE MEDICATIONS:  1. Percocet  p.r.n.  2. Skelaxin p.r.n.  3. Multivitamin daily.  4. Calcium daily.  5. Laxative as needed.  6. Avoid NSAIDs for 3 months.      Verlin Fester, P.A.                       Sharolyn Douglas, M.D.    CM/MEDQ  D:  02/28/2004  T:  02/28/2004  Job:  [id number]
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
## 2010-12-27 NOTE — Op Note (Signed)
NAME:  John Esparza, John Esparza NO.:  [id number]   MEDICAL RECORD NO.:  [id number]                   PATIENT TYPE:  INP   LOCATION:  6705                                 FACILITY:  [facility]   PHYSICIAN:  Sharolyn Douglas, M.D.                     DATE OF BIRTH:  [birth year]   DATE OF PROCEDURE:  02/01/2004  DATE OF DISCHARGE:  02/04/2004                                 OPERATIVE REPORT   PREOPERATIVE DIAGNOSIS:  Degenerative disk disease L5-S1.   POSTOPERATIVE DIAGNOSIS:  Degenerative disk disease L5-S1.   OPERATION PERFORMED:  L5-S1 disk replacement utilizing the Charite disk  replacement prosthesis.   SURGEON:  Sharolyn Douglas, M.D.   ASSISTANT:  Verlin Fester, P.A.   ANESTHESIA:  General endotracheal.   EXPOSURE SURGEON:  Adolph Pollack, M.D.   COMPLICATIONS:  None.   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  He has been refractory to  all conservative options.  His discogram was concordant at L5-S1 with normal  controls at the cephalad levels.  He has elected to undergo disk replacement  in hopes of improving his symptoms.  I have extensively discussed with him  the pros and cons of a disk replacement versus spinal fusions.  He knows the  risks involved.  He was given literature and has done his own  investigations.  He elected to proceed.   DESCRIPTION OF PROCEDURE:  The patient was properly identified in the  holding area and taken to the operating room.  He underwent general  endotracheal anesthesia without difficulty.  He was given prophylactic IV  antibiotics.  He was carefully position on the radiolucent operating room  table with the L5-S1 level placed at the break in the table so that the  spine could be flexed and extended as necessary for access to the  interspace.  All bony prominences were padded.   Face and eyes were protected  at all times.  Back prepped and draped in the usual sterile fashion.  Dr.  Abbey Chatters performed a retroperitoneal approach to the L5-S1 interspace.  He  then scrubbed out and [facility] [place] and myself proceeded to carry out  the disk replacement operation at L5-S1.  We identified the level using  fluoroscopy.  We carefully identified the iliac vessels and ureter.  These  structures were protected at all times using hand held retractors.  Sharp  annulotomy was carried out.  A radical diskectomy was carried back to the  posterior longitudinal ligament.  Great care was taken to protect the  subchondral bone.  The cartilaginous end plates were removed.  The posterior  lateral recess was removed of all disk material.  The posterior longitudinal  ligament was released using a curet.  Posterior  osteophytes were identified  off the end plate of S1 posteriorly and these were taken down using the  curet.  Distractors were used to sequentially dilate the disk space.  Supporting the prosthesis.  The various trials were then placed in the disk  space and checked with AP and lateral fluoroscopy.  We selected a size 3 for  this patient.  We were limited in the anterior posterior extent.  The  anterior margin of the sacrum was sloped making it impossible to place a  larger implant.  We then selected a 5 mm lordotic trial and tamped it into  the interspace and posteriorly.  AP and lateral fluoroscopy was utilized to  position the trial implant precisely in the middle on the anterior posterior  view and all the way posteriorly on the lateral view.  The 5 mm lordotic  trial had an excellent interference fit and this was felt to be the most  appropriate lordotic angle for this patient.  A marking pen was placed for a  midline reference point. The trial was removed and the #3 pilot driver was  impacted into the interspace and to the posterior margin of the vertebral  end plate  which was confirmed with lateral fluoroscopy.  The pilot driver  was then removed and the implant loaded onto the insertion driver.  The 5 mm  lordosis was placed inferiorly.  The implant was impacted into position  using the midline marking screw as reference.  Lateral fluoroscopy was used  to confirm that the prosthesis was placed as far posteriorly as possible.  We then distracted the disk space and the 8.5 mm poly insert was inserted.  The distraction was removed.  The handle was removed from the prosthesis.  We had an excellent interference fit.  The wound was irrigated.  Dr.  Abbey Chatters then scrubbed back in.  The midline marking screw was removed.  The iliac vessels were inspected.  There was no bleeding.  The ureter was  also evaluated and found to be intact.  Dr. Abbey Chatters then closed the wound  in layers.  Skin staples were used on the skin.  Sterile dressing applied.  The patient was extubated without difficulty.  He had good pulses distally  and was able to move his upper and lower extremities when he was transported  to the recovery room in stable condition.                                               Sharolyn Douglas, M.D.    MC/MEDQ  D:  02/04/2004  T:  02/05/2004  Job:  [id number]
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
## 2011-06-30 ENCOUNTER — Encounter: Payer: Self-pay | Admitting: Cardiovascular Disease

## 2011-07-02 ENCOUNTER — Ambulatory Visit: Payer: Self-pay | Admitting: Cardiovascular Disease

## 2011-10-10 ENCOUNTER — Other Ambulatory Visit: Payer: Self-pay | Admitting: Cardiovascular Disease

## 2012-04-11 ENCOUNTER — Encounter (HOSPITAL_COMMUNITY): Payer: Self-pay | Admitting: *Deleted

## 2012-04-11 ENCOUNTER — Emergency Department (HOSPITAL_COMMUNITY)
Admission: EM | Admit: 2012-04-11 | Discharge: 2012-04-11 | Disposition: A | Discharge status: Home / Self Care | Payer: Worker's Compensation | Attending: Emergency Medicine | Admitting: Emergency Medicine

## 2012-04-11 DIAGNOSIS — Z7721 Contact with and (suspected) exposure to potentially hazardous body fluids: Secondary | ICD-10-CM | POA: Insufficient documentation

## 2012-04-11 DIAGNOSIS — Z7982 Long term (current) use of aspirin: Secondary | ICD-10-CM | POA: Insufficient documentation

## 2012-04-11 DIAGNOSIS — Z20828 Contact with and (suspected) exposure to other viral communicable diseases: Secondary | ICD-10-CM | POA: Insufficient documentation

## 2012-04-11 DIAGNOSIS — I10 Essential (primary) hypertension: Secondary | ICD-10-CM | POA: Insufficient documentation

## 2012-04-11 DIAGNOSIS — Z23 Encounter for immunization: Secondary | ICD-10-CM | POA: Insufficient documentation

## 2012-04-11 DIAGNOSIS — Z206 Contact with and (suspected) exposure to human immunodeficiency virus [HIV]: Secondary | ICD-10-CM

## 2012-04-11 DIAGNOSIS — Z9089 Acquired absence of other organs: Secondary | ICD-10-CM | POA: Insufficient documentation

## 2012-04-11 LAB — URINALYSIS, ROUTINE W REFLEX MICROSCOPIC
Bilirubin Urine: NEGATIVE
Glucose, UA: NEGATIVE mg/dL
Hgb urine dipstick: NEGATIVE
Ketones, ur: NEGATIVE mg/dL
Leukocytes, UA: NEGATIVE
Nitrite: NEGATIVE
Protein, ur: NEGATIVE mg/dL
Specific Gravity, Urine: 1.022 (ref 1.005–1.030)
Urobilinogen, UA: 0.2 mg/dL (ref 0.0–1.0)
pH: 6 (ref 5.0–8.0)

## 2012-04-11 LAB — COMPREHENSIVE METABOLIC PANEL WITH GFR
ALT: 47 U/L (ref 0–53)
AST: 30 U/L (ref 0–37)
Albumin: 4.2 g/dL (ref 3.5–5.2)
Alkaline Phosphatase: 65 U/L (ref 39–117)
BUN: 10 mg/dL (ref 6–23)
CO2: 29 meq/L (ref 19–32)
Calcium: 10 mg/dL (ref 8.4–10.5)
Chloride: 100 meq/L (ref 96–112)
Creatinine, Ser: 1.07 mg/dL (ref 0.50–1.35)
GFR calc Af Amer: >90 mL/min
GFR calc non Af Amer: 82 mL/min — ABNORMAL LOW
Glucose, Bld: 100 mg/dL — ABNORMAL HIGH (ref 70–99)
Potassium: 4.1 meq/L (ref 3.5–5.1)
Sodium: 139 meq/L (ref 135–145)
Total Bilirubin: 0.2 mg/dL — ABNORMAL LOW (ref 0.3–1.2)
Total Protein: 7.9 g/dL (ref 6.0–8.3)

## 2012-04-11 LAB — CBC WITH DIFFERENTIAL/PLATELET
Basophils Absolute: 0.1 10*3/uL (ref 0.0–0.1)
Basophils Relative: 1 % (ref 0–1)
Eosinophils Absolute: 0.2 10*3/uL (ref 0.0–0.7)
MCH: 32.4 pg (ref 26.0–34.0)
MCHC: 35.3 g/dL (ref 30.0–36.0)
Neutrophils Relative %: 53 % (ref 43–77)
Platelets: 199 10*3/uL (ref 150–400)
RBC: 5 MIL/uL (ref 4.22–5.81)
RDW: 12.2 % (ref 11.5–15.5)

## 2012-04-11 LAB — AMYLASE: Amylase: 32 U/L (ref 0–105)

## 2012-04-11 LAB — HIV RAPID SCREEN (BLD OR BODY FLD EXPOSURE): Rapid HIV Screen: NONREACTIVE

## 2012-04-11 LAB — HEPATITIS B SURFACE ANTIGEN: Hepatitis B Surface Ag: NEGATIVE

## 2012-04-11 MED ORDER — LOPINAVIR-RITONAVIR 200-50 MG PO TABS: 2.0000 | ORAL_TABLET | Freq: Two times a day (BID) | ORAL | Status: DC

## 2012-04-11 MED ORDER — EMTRICITABINE-TENOFOVIR DF 200-300 MG PO TABS: 1.0000 | ORAL_TABLET | Freq: Every day | ORAL | Status: DC

## 2012-04-11 MED ORDER — TETANUS-DIPHTH-ACELL PERTUSSIS 5-2.5-18.5 LF-MCG/0.5 IM SUSP
0.5000 mL | Freq: Once | INTRAMUSCULAR | Status: AC
  Administered 2012-04-11: 0.5 mL via INTRAMUSCULAR
  Filled 2012-04-11: qty 0.5

## 2012-04-11 MED ORDER — LOPINAVIR-RITONAVIR 200-50 MG PO TABS
2.0000 | ORAL_TABLET | Freq: Once | ORAL | Status: AC
  Administered 2012-04-11: 2 via ORAL
  Filled 2012-04-11: qty 2

## 2012-04-11 MED ORDER — EMTRICITABINE-TENOFOVIR DF 200-300 MG PO TABS
1.0000 | ORAL_TABLET | Freq: Every day | ORAL | Status: DC
  Administered 2012-04-11: 1 via ORAL
  Filled 2012-04-11: qty 1
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
## 2012-04-11 NOTE — ED Notes (Signed)
Lab tech did not draw correct tubes when she drew labs on pt earlier in stay. Pt was discharged and this RN called pt to have to return to have needed labs drawn per blood exposure protocol. Pt was able to return and needed labs were drawn by this RN.
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
## 2012-05-14 ENCOUNTER — Ambulatory Visit (INDEPENDENT_AMBULATORY_CARE_PROVIDER_SITE_OTHER): Payer: 59 | Admitting: Family Medicine

## 2012-05-14 ENCOUNTER — Ambulatory Visit: Payer: 59

## 2012-05-14 VITALS — BP 158/90 | HR 100 | Temp 98.2°F | Resp 16 | Ht 71.0 in | Wt 260.0 lb

## 2012-05-14 DIAGNOSIS — M79609 Pain in unspecified limb: Secondary | ICD-10-CM

## 2012-05-14 DIAGNOSIS — M542 Cervicalgia: Secondary | ICD-10-CM

## 2012-05-14 DIAGNOSIS — F419 Anxiety disorder, unspecified: Secondary | ICD-10-CM

## 2012-05-14 DIAGNOSIS — M79603 Pain in arm, unspecified: Secondary | ICD-10-CM

## 2012-05-14 DIAGNOSIS — I219 Acute myocardial infarction, unspecified: Secondary | ICD-10-CM

## 2012-05-14 DIAGNOSIS — F411 Generalized anxiety disorder: Secondary | ICD-10-CM

## 2012-05-14 DIAGNOSIS — M545 Low back pain, unspecified: Secondary | ICD-10-CM

## 2012-05-14 MED ORDER — CYCLOBENZAPRINE HCL 10 MG PO TABS: 10.0000 mg | ORAL_TABLET | Freq: Two times a day (BID) | ORAL | Status: DC | PRN

## 2012-05-14 MED ORDER — ALPRAZOLAM 0.5 MG PO TABS: 0.5000 mg | ORAL_TABLET | Freq: Three times a day (TID) | ORAL | Status: DC | PRN

## 2012-05-14 MED ORDER — ATENOLOL 50 MG PO TABS: 25.0000 mg | ORAL_TABLET | Freq: Every day | ORAL | Status: DC

## 2012-05-14 MED ORDER — ROSUVASTATIN CALCIUM 20 MG PO TABS: 20.0000 mg | ORAL_TABLET | Freq: Every day | ORAL | Status: DC

## 2012-05-14 MED ORDER — PREDNISONE 20 MG PO TABS: ORAL_TABLET | ORAL | Status: DC

## 2012-05-14 NOTE — Patient Instructions (Addendum)
Let me know if your arm/ shoulder is not better in the next few days.  In that case I will refer you to a sports med specialist.    Start back on your BP and cholesterol meds!    Take care
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
## 2012-05-18 ENCOUNTER — Encounter: Payer: Self-pay | Admitting: Family Medicine

## 2013-06-06 ENCOUNTER — Other Ambulatory Visit: Payer: Self-pay | Admitting: Family Medicine

## 2014-02-12 ENCOUNTER — Emergency Department (HOSPITAL_BASED_OUTPATIENT_CLINIC_OR_DEPARTMENT_OTHER): Payer: 59

## 2014-02-12 ENCOUNTER — Emergency Department (HOSPITAL_BASED_OUTPATIENT_CLINIC_OR_DEPARTMENT_OTHER)
Admission: EM | Admit: 2014-02-12 | Discharge: 2014-02-12 | Disposition: A | Discharge status: Home / Self Care | Payer: 59 | Attending: Emergency Medicine | Admitting: Emergency Medicine

## 2014-02-12 ENCOUNTER — Encounter (HOSPITAL_BASED_OUTPATIENT_CLINIC_OR_DEPARTMENT_OTHER): Payer: Self-pay | Admitting: Emergency Medicine

## 2014-02-12 DIAGNOSIS — S93401A Sprain of unspecified ligament of right ankle, initial encounter: Secondary | ICD-10-CM

## 2014-02-12 DIAGNOSIS — I252 Old myocardial infarction: Secondary | ICD-10-CM | POA: Insufficient documentation

## 2014-02-12 DIAGNOSIS — X500XXA Overexertion from strenuous movement or load, initial encounter: Secondary | ICD-10-CM | POA: Insufficient documentation

## 2014-02-12 DIAGNOSIS — I10 Essential (primary) hypertension: Secondary | ICD-10-CM | POA: Insufficient documentation

## 2014-02-12 DIAGNOSIS — IMO0002 Reserved for concepts with insufficient information to code with codable children: Secondary | ICD-10-CM | POA: Insufficient documentation

## 2014-02-12 DIAGNOSIS — Z79899 Other long term (current) drug therapy: Secondary | ICD-10-CM | POA: Insufficient documentation

## 2014-02-12 DIAGNOSIS — S93409A Sprain of unspecified ligament of unspecified ankle, initial encounter: Secondary | ICD-10-CM | POA: Insufficient documentation

## 2014-02-12 DIAGNOSIS — Y929 Unspecified place or not applicable: Secondary | ICD-10-CM | POA: Insufficient documentation

## 2014-02-12 DIAGNOSIS — F411 Generalized anxiety disorder: Secondary | ICD-10-CM | POA: Insufficient documentation

## 2014-02-12 DIAGNOSIS — Z7982 Long term (current) use of aspirin: Secondary | ICD-10-CM | POA: Insufficient documentation

## 2014-02-12 DIAGNOSIS — Y9302 Activity, running: Secondary | ICD-10-CM | POA: Insufficient documentation

## 2014-02-12 MED ORDER — OXYCODONE-ACETAMINOPHEN 5-325 MG PO TABS
2.0000 | ORAL_TABLET | Freq: Once | ORAL | Status: AC
  Administered 2014-02-12: 2 via ORAL
  Filled 2014-02-12: qty 2

## 2014-02-12 MED ORDER — HYDROCODONE-ACETAMINOPHEN 5-325 MG PO TABS: 1.0000 | ORAL_TABLET | Freq: Four times a day (QID) | ORAL | Status: DC | PRN

## 2014-02-12 NOTE — ED Notes (Signed)
Pt reports tripping on rock with right anke and it twisted. Pain and swelling noted.

## 2014-02-12 NOTE — ED Provider Notes (Signed)
CSN: [id number]     Arrival date & time 02/12/14  1743 History   First MD Initiated Contact with Patient 02/12/14 2003   This chart was scribed for Shanna Cisco, MD by Gwenevere Abbot, ED scribe. This patient was seen in room MHT13/MHT13 and the patient's care was started at 8:07 PM.    Chief Complaint  Patient presents with  . Ankle Pain   Ankle Pain  The incident occurred 6 to 12 hours ago. Incident location: A trail. Injury mechanism: Running. The pain is present in the right foot and right ankle. The pain is severe. The pain has been constant since onset. Associated symptoms include an inability to bear weight. Pertinent negatives include no numbness.  HPI Comments:  John Esparza is a 48 y.o. male who presents to the Emergency Department complaining of ankle pain, with associated swelling, onset 2:00PM today. Pt reports that he was running on a trail and that his ankle "folded." Pt reports that it is difficult to bear weight, but has significant pain.   Past Medical History  Diagnosis Date  . NSTEMI (non-ST elevated myocardial infarction) 1.1.2011    with two overlapping drug eluting stents mid lad  . HTN (hypertension)   . Back problem   . Anxiety    Past Surgical History  Procedure Laterality Date  . Disk repla      L5-S1  . Appendectomy    . Retinal detachment surgery  age 63    traumatic   Family History  Problem Relation Age of Onset  . Hypertension Mother     with copd  . Heart attack Father   . Hypertension Father     with copd  . Heart attack Brother     three brothers  . Lupus Sister     twin  . Coronary artery disease Brother     died following procedure   History  Substance Use Topics  . Smoking status: Never Smoker   . Smokeless tobacco: Not on file  . Alcohol Use: 0.0 oz/week     Comment: drinks 12 packs of beer a week    Review of Systems  Constitutional: Negative for fever, activity change, appetite change and fatigue.  HENT: Negative for  congestion, facial swelling, rhinorrhea and trouble swallowing.   Eyes: Negative for photophobia and pain.  Respiratory: Negative for cough, chest tightness and shortness of breath.   Cardiovascular: Negative for chest pain and leg swelling.  Gastrointestinal: Negative for nausea, vomiting, abdominal pain, diarrhea and constipation.  Endocrine: Negative for polydipsia and polyuria.  Genitourinary: Negative for dysuria, urgency, decreased urine volume and difficulty urinating.  Musculoskeletal: Positive for joint swelling. Negative for back pain and gait problem.       Right ankle   Skin: Negative for color change, rash and wound.  Allergic/Immunologic: Negative for immunocompromised state.  Neurological: Negative for dizziness, facial asymmetry, speech difficulty, weakness, numbness and headaches.  Psychiatric/Behavioral: Negative for confusion, decreased concentration and agitation.      Allergies  Demerol  Home Medications   Prior to Admission medications   Medication Sig Start Date End Date Taking? Authorizing Provider  aspirin 325 MG EC tablet Take 325 mg by mouth daily.   Yes Historical Provider, MD  atenolol (TENORMIN) 50 MG tablet Take 0.5 tablets (25 mg total) by mouth daily. PATIENT NEEDS OFFICE VISIT FOR ADDITIONAL REFILLS 06/06/13  Yes Eleanore Delia Chimes, PA-C  ALPRAZolam Prudy Feeler) 0.5 MG tablet Take 1 tablet (0.5 mg total) by mouth  3 (three) times daily as needed. For anxiety 05/14/12   Pearline CablesJessica C Copland, MD  aspirin 81 MG tablet Take 324 mg by mouth daily.     Historical Provider, MD  cyclobenzaprine (FLEXERIL) 10 MG tablet Take 1 tablet (10 mg total) by mouth 2 (two) times daily as needed for muscle spasms. 05/14/12   Pearline CablesJessica C Copland, MD  HYDROcodone-acetaminophen (NORCO) 5-325 MG per tablet Take 1 tablet by mouth every 6 (six) hours as needed for severe pain. 02/12/14   Shanna CiscoMegan E Docherty, MD  NITROSTAT 0.4 MG SL tablet DISSOLVE ONE TABLET UNDER THE TONGUE EVERY 5 MINUTES AS  NEEDED FOR CHEST PAIN.  DO NOT EXCEED A TOTAL OF 3 DOSES IN 15 MINUTES 10/10/11   Kathleene Hazelhristopher D McAlhany, MD  predniSONE (DELTASONE) 20 MG tablet Take 2 pills a day for 3 days, then 1 pill a day for 3 days. 05/14/12   Gwenlyn FoundJessica C Copland, MD  rosuvastatin (CRESTOR) 20 MG tablet Take 1 tablet (20 mg total) by mouth daily. PATIENT NEEDS OFFICE VISIT FOR ADDITIONAL REFILLS 06/06/13   Godfrey PickEleanore E Egan, PA-C   BP 156/99  Pulse 79  Temp(Src) 98.1 F (36.7 C) (Oral)  Ht 6' (1.829 m)  Wt 245 lb (111.131 kg)  BMI 33.22 kg/m2  SpO2 98% Physical Exam  Constitutional: He is oriented to person, place, and time. He appears well-developed and well-nourished. No distress.  HENT:  Head: Normocephalic and atraumatic.  Mouth/Throat: No oropharyngeal exudate.  Eyes: Pupils are equal, round, and reactive to light.  Neck: Normal range of motion. Neck supple.  Cardiovascular: Normal rate, regular rhythm and normal heart sounds.  Exam reveals no gallop and no friction rub.   No murmur heard. Pulmonary/Chest: Effort normal and breath sounds normal. No respiratory distress. He has no wheezes. He has no rales.  Abdominal: Soft. Bowel sounds are normal. He exhibits no distension and no mass. There is no tenderness. There is no rebound and no guarding.  Musculoskeletal: Normal range of motion. He exhibits no edema.       Right ankle: He exhibits swelling. He exhibits normal range of motion and no ecchymosis. Tenderness. Lateral malleolus tenderness found. Achilles tendon normal.       Feet:  Soft tissue swelling of dorsal mid foot, and lateral malleolus. NVI distal   Neurological: He is alert and oriented to person, place, and time.  Skin: Skin is warm and dry.  Psychiatric: He has a normal mood and affect.    ED Course  Procedures  DIAGNOSTIC STUDIES: Oxygen Saturation is 98% on RA, normal by my interpretation.  COORDINATION OF CARE: 8:10 PM-Discussed treatment plan with pt at bedside and pt agreed to  plan.  Imaging Review Dg Ankle Complete Right  02/12/2014   CLINICAL DATA:  Right ankle pain, swelling.  EXAM: RIGHT ANKLE - COMPLETE 3+ VIEW  COMPARISON:  None.  FINDINGS: There is no evidence of fracture, dislocation, or joint effusion. There is no evidence of arthropathy or other focal bone abnormality. Soft tissues are unremarkable.  IMPRESSION: Negative.   Electronically Signed   By: Charlett NoseKevin  Dover M.D.   On: 02/12/2014 18:36   Dg Foot 2 Views Right  02/12/2014   CLINICAL DATA:  Fall. Inversion injury. Right foot pain and swelling.  EXAM: RIGHT FOOT - 2 VIEW  COMPARISON:  None.  FINDINGS: There is no evidence of fracture or dislocation. There is no evidence of arthropathy or other focal bone abnormality. Soft tissues are unremarkable.  IMPRESSION: Negative.  Electronically Signed   By: Myles RosenthalJohn  Stahl M.D.   On: 02/12/2014 20:32     EKG Interpretation None      MDM   Final diagnoses:  Ankle sprain, right, initial encounter    Pt is a 48 y.o. male with Pmhx as above who presents with R ankle/foot pain & swelling after an ankle inversion injury while running.  XR foot & ankle negative for acute fx. NVI on PE. Will d/c home w/  Air cast, rec for RICE, NSAIDS, norco for breakthrough pain.    I personally performed the services described in this documentation, which was scribed in my presence. The recorded information has been reviewed and is accurate.       Shanna CiscoMegan E Docherty, MD 02/12/14 2059

## 2014-02-12 NOTE — Discharge Instructions (Signed)

## 2014-02-12 NOTE — ED Notes (Signed)
The patient did not want the crutches. I placed an ASO on the patients right ankle I also placed his foot into a post-op shoe.

## 2014-03-30 ENCOUNTER — Ambulatory Visit (INDEPENDENT_AMBULATORY_CARE_PROVIDER_SITE_OTHER): Payer: 59 | Admitting: Family Medicine

## 2014-03-30 VITALS — BP 142/82 | HR 62 | Temp 98.1°F | Resp 20 | Ht 72.0 in | Wt 244.0 lb

## 2014-03-30 DIAGNOSIS — E785 Hyperlipidemia, unspecified: Secondary | ICD-10-CM

## 2014-03-30 DIAGNOSIS — F419 Anxiety disorder, unspecified: Secondary | ICD-10-CM

## 2014-03-30 DIAGNOSIS — G4700 Insomnia, unspecified: Secondary | ICD-10-CM

## 2014-03-30 DIAGNOSIS — F411 Generalized anxiety disorder: Secondary | ICD-10-CM

## 2014-03-30 DIAGNOSIS — Z733 Stress, not elsewhere classified: Secondary | ICD-10-CM

## 2014-03-30 DIAGNOSIS — I10 Essential (primary) hypertension: Secondary | ICD-10-CM

## 2014-03-30 DIAGNOSIS — I252 Old myocardial infarction: Secondary | ICD-10-CM

## 2014-03-30 DIAGNOSIS — F439 Reaction to severe stress, unspecified: Secondary | ICD-10-CM

## 2014-03-30 MED ORDER — ATENOLOL 50 MG PO TABS: 25.0000 mg | ORAL_TABLET | Freq: Every day | ORAL | Status: DC

## 2014-03-30 MED ORDER — ATORVASTATIN CALCIUM 20 MG PO TABS: 20.0000 mg | ORAL_TABLET | Freq: Every day | ORAL | Status: DC

## 2014-03-30 MED ORDER — ALPRAZOLAM 0.5 MG PO TABS: 0.5000 mg | ORAL_TABLET | Freq: Every evening | ORAL | Status: DC | PRN

## 2014-03-30 MED ORDER — NITROGLYCERIN 0.4 MG SL SUBL: SUBLINGUAL_TABLET | SUBLINGUAL | Status: DC

## 2014-03-30 NOTE — Progress Notes (Signed)
Chief Complaint:  Chief Complaint  Patient presents with  . Hypertension    recheck on BP. out of all meds    HPI: John Esparza is a 48 y.o. male who is here for  Medication refills. He is only here because he is going to be in a mud run  and was working out and his BP was in the 180/80s and since he has had an NSTEMI at the age of 48 he did not want that to happen again. He has not been taking  his meds for the last 2 months, he states his BP has been well under control since he has changed his diet He is also not on crestor sicne he said the copay was too expensive it was $40  Per prescriptin He has been eating right and exercising He ha s a lot of stress, job and also his 48 daughter just had an "oops" she had a baby and now there is mama baby drama He is a Field seismologist but now servin warrants  Which is less stressful and can come in for appts. He has some  He Had NSTEMI 48 years ago, denies any CP, SOB, palpitations  Past Medical History  Diagnosis Date  . NSTEMI (non-ST elevated myocardial infarction) 1.1.2011    with two overlapping drug eluting stents mid lad  . HTN (hypertension)   . Back problem   . Anxiety    Past Surgical History  Procedure Laterality Date  . Disk repla      L5-S1  . Appendectomy    . Retinal detachment surgery  age 40    traumatic   History   Social History  . Marital Status: Single    Spouse Name: N/A    Number of Children: N/A  . Years of Education: N/A   Social History Main Topics  . Smoking status: Never Smoker   . Smokeless tobacco: None  . Alcohol Use: 0.0 oz/week     Comment: drinks 12 packs of beer a week  . Drug Use: No  . Sexual Activity: None   Other Topics Concern  . None   Social History Narrative  . None   Family History  Problem Relation Age of Onset  . Hypertension Mother     with copd  . Heart attack Father   . Hypertension Father     with copd  . Heart attack Brother     three brothers  . Lupus  Sister     twin  . Coronary artery disease Brother     died following procedure   Allergies  Allergen Reactions  . Demerol [Meperidine]     Irregular heartbeat    Prior to Admission medications   Medication Sig Start Date End Date Taking? Authorizing Provider  ALPRAZolam Prudy Feeler) 0.5 MG tablet Take 1 tablet (0.5 mg total) by mouth 3 (three) times daily as needed. For anxiety 05/14/12   Pearline Cables, MD  aspirin 325 MG EC tablet Take 325 mg by mouth daily.    Historical Provider, MD  atenolol (TENORMIN) 50 MG tablet Take 0.5 tablets (25 mg total) by mouth daily. PATIENT NEEDS OFFICE VISIT FOR ADDITIONAL REFILLS 06/06/13   Godfrey Pick, PA-C  cyclobenzaprine (FLEXERIL) 10 MG tablet Take 1 tablet (10 mg total) by mouth 2 (two) times daily as needed for muscle spasms. 05/14/12   Pearline Cables, MD  HYDROcodone-acetaminophen (NORCO) 5-325 MG per tablet Take 1 tablet by mouth  every 6 (six) hours as needed for severe pain. 02/12/14   Toy CookeyMegan Docherty, MD  NITROSTAT 0.4 MG SL tablet DISSOLVE ONE TABLET UNDER THE TONGUE EVERY 5 MINUTES AS NEEDED FOR CHEST PAIN.  DO NOT EXCEED A TOTAL OF 3 DOSES IN 15 MINUTES 10/10/11   Kathleene Hazelhristopher D McAlhany, MD  rosuvastatin (CRESTOR) 20 MG tablet Take 1 tablet (20 mg total) by mouth daily. PATIENT NEEDS OFFICE VISIT FOR ADDITIONAL REFILLS 06/06/13   Godfrey PickEleanore E Egan, PA-C     ROS: The patient denies fevers, chills, night sweats, unintentional weight loss, chest pain, palpitations, wheezing, dyspnea on exertion, nausea, vomiting, abdominal pain, dysuria, hematuria, melena, numbness, weakness, or tingling.   All other systems have been reviewed and were otherwise negative with the exception of those mentioned in the HPI and as above.    PHYSICAL EXAM: Filed Vitals:   03/30/14 1923  BP: 142/82  Pulse: 62  Temp: 98.1 F (36.7 C)  Resp: 20   Filed Vitals:   03/30/14 1923  Height: 6' (1.829 m)  Weight: 244 lb (110.678 kg)   Body mass index is 33.09  kg/(m^2).  General: Alert, no acute distress HEENT:  Normocephalic, atraumatic, oropharynx patent. EOMI, PERRLA, fundo exam normal, no thyroidmegaly Cardiovascular:  Regular rate and rhythm, no rubs murmurs or gallops.  No Carotid bruits, radial pulse intact. No pedal edema.  Respiratory: Clear to auscultation bilaterally.  No wheezes, rales, or rhonchi.  No cyanosis, no use of accessory musculature GI: No organomegaly, abdomen is soft and non-tender, positive bowel sounds.  No masses. Skin: No rashes. Neurologic: Facial musculature symmetric. Psychiatric: Patient is appropriate throughout our interaction. Lymphatic: No cervical lymphadenopathy Musculoskeletal: Gait intact.   LABS:    EKG/XRAY:   Primary [facility]   ASSESSMENT/PLAN: Encounter Diagnoses  Name Primary?  . Other and unspecified hyperlipidemia Yes  . Essential hypertension   . History of non-ST elevation myocardial infarction (NSTEMI)   . Stress   . Insomnia   . Anxiety    Refilled HTN and statin meds for 1 year Labs ordered for fasting blood work : TSH, LIPID and CMP future orders in place. He did not fast today He will get xanax x 1 month for situational stress/insomnia, lots of family issues, 48 daughter is unexpectedly pregnant F/u  for blood work  Gross sideeffects, risk and benefits, and alternatives of medications d/w patient. Patient is aware that all medications have potential sideeffects and we are unable to predict every sideeffect or drug-drug interaction that may occur.  Hamilton CapriLE, Ernst Cumpston PHUONG, DO 03/30/2014 8:31 PM

## 2014-11-25 ENCOUNTER — Ambulatory Visit (INDEPENDENT_AMBULATORY_CARE_PROVIDER_SITE_OTHER): Payer: 59 | Admitting: Family Medicine

## 2014-11-25 VITALS — BP 144/90 | HR 67 | Temp 98.2°F | Resp 16 | Ht 71.5 in | Wt 248.0 lb

## 2014-11-25 DIAGNOSIS — I10 Essential (primary) hypertension: Secondary | ICD-10-CM | POA: Diagnosis not present

## 2014-11-25 DIAGNOSIS — E785 Hyperlipidemia, unspecified: Secondary | ICD-10-CM | POA: Diagnosis not present

## 2014-11-25 DIAGNOSIS — F419 Anxiety disorder, unspecified: Secondary | ICD-10-CM

## 2014-11-25 DIAGNOSIS — M2550 Pain in unspecified joint: Secondary | ICD-10-CM

## 2014-11-25 DIAGNOSIS — Z8249 Family history of ischemic heart disease and other diseases of the circulatory system: Secondary | ICD-10-CM

## 2014-11-25 DIAGNOSIS — I252 Old myocardial infarction: Secondary | ICD-10-CM | POA: Diagnosis not present

## 2014-11-25 LAB — LIPID PANEL
Cholesterol: 163 mg/dL (ref 0–200)
HDL: 44 mg/dL (ref >=40)
LDL Cholesterol: 71 mg/dL (ref 0–99)
Total CHOL/HDL Ratio: 3.7 Ratio
Triglycerides: 239 mg/dL — ABNORMAL HIGH (ref <150)
VLDL: 48 mg/dL — ABNORMAL HIGH (ref 0–40)

## 2014-11-25 LAB — COMPREHENSIVE METABOLIC PANEL
ALT: 71 U/L — ABNORMAL HIGH (ref 0–53)
AST: 32 U/L (ref 0–37)
Albumin: 4.3 g/dL (ref 3.5–5.2)
Alkaline Phosphatase: 67 U/L (ref 39–117)
BUN: 13 mg/dL (ref 6–23)
CO2: 27 mEq/L (ref 19–32)
Calcium: 9.8 mg/dL (ref 8.4–10.5)
Chloride: 102 mEq/L (ref 96–112)
Creat: 0.99 mg/dL (ref 0.50–1.35)
Glucose, Bld: 98 mg/dL (ref 70–99)
Potassium: 4.5 mEq/L (ref 3.5–5.3)
Sodium: 139 mEq/L (ref 135–145)
Total Bilirubin: 0.3 mg/dL (ref 0.2–1.2)
Total Protein: 8.1 g/dL (ref 6.0–8.3)

## 2014-11-25 LAB — POCT SEDIMENTATION RATE: POCT SED RATE: 18 mm/hr (ref 0–22)

## 2014-11-25 LAB — RHEUMATOID FACTOR: Rhuematoid fact SerPl-aCnc: <10 IU/mL (ref <=14)

## 2014-11-25 LAB — CK: Total CK: 88 U/L (ref 7–232)

## 2014-11-25 MED ORDER — PREDNISONE 20 MG PO TABS: 40.0000 mg | ORAL_TABLET | Freq: Every day | ORAL | Status: DC

## 2014-11-25 MED ORDER — ROSUVASTATIN CALCIUM 10 MG PO TABS: 10.0000 mg | ORAL_TABLET | Freq: Every day | ORAL | Status: DC

## 2014-11-25 MED ORDER — ATENOLOL 50 MG PO TABS: 50.0000 mg | ORAL_TABLET | Freq: Every day | ORAL | Status: DC

## 2014-11-25 MED ORDER — ALPRAZOLAM 0.5 MG PO TABS: 0.5000 mg | ORAL_TABLET | Freq: Every evening | ORAL | Status: DC | PRN

## 2014-11-25 MED ORDER — NITROGLYCERIN 0.4 MG SL SUBL: SUBLINGUAL_TABLET | SUBLINGUAL | Status: DC

## 2014-11-25 NOTE — Progress Notes (Signed)
[name], male   DOB: [birth year], 49 y.o.   MRN: [id number]   This chart was scribed for Elvina Sidle, MD by [facility], medical scribe at [facility].The patient was seen in exam room 09 and the patient's care was started at 11:50 AM.  [name] MRN: [id number], DOB: 02-01-1966, 49 y.o. Date of Encounter: 11/25/2014  Primary Physician: No PCP Per Patient  Chief Complaint:  Chief Complaint  Patient presents with   Medication Refill    ? about Atorvastatin & Atenolol   Stress    Refill Alprazolam   Pain in joints   HPI:  John Esparza is a 49 y.o. male who presents to [facility] for medication refill and pain in his joints. He has pain in shoulders and elbows. Elbows worse than shoulders. No knee pain. Joints are tender to the touch. He also has pain the muscles of his lower extremities. He believes this due to his Lipitor. Pt has trouble sleeping due to the pain. He has a history of psoriasis. Pt exercises frequently and is fit. He does not believe the joint pain is due to his exercise, he says exercises helps his joint pain. He does have FHx of hyperlipidemia and hypertension. He need a refill for xanax and atenolol. Pt is very stressed and his mind is constantly screaming in his head.Pt works in Patent examiner.  Past Medical History  Diagnosis Date   NSTEMI (non-ST elevated myocardial infarction) 1.1.2011    with two overlapping drug eluting stents mid lad   HTN (hypertension)    Back problem    Anxiety     Home Meds: Prior to Admission medications   Medication Sig Start Date End Date Taking? Authorizing Provider  ALPRAZolam Prudy Feeler) 0.5 MG tablet Take 1 tablet (0.5 mg total) by mouth at bedtime as needed for anxiety or sleep. For anxiety 03/30/14  Yes Thao P Le, DO  aspirin 325 MG EC tablet Take 325 mg by mouth daily.   Yes Historical Provider, MD  atenolol (TENORMIN) 50 MG tablet Take 0.5 tablets (25  mg total) by mouth daily. 03/30/14  Yes Thao P Le, DO  atorvastatin (LIPITOR) 20 MG tablet Take 1 tablet (20 mg total) by mouth daily. 03/30/14  Yes Thao P Le, DO  nitroGLYCERIN (NITROSTAT) 0.4 MG SL tablet DISSOLVE ONE TABLET UNDER THE TONGUE EVERY 5 MINUTES AS NEEDED FOR CHEST PAIN.  DO NOT EXCEED A TOTAL OF 3 DOSES IN 15 MINUTES 03/30/14  Yes Thao P Le, DO   Allergies:  Allergies  Allergen Reactions   Demerol [Meperidine]     Irregular heartbeat    History   Social History   Marital Status: Single    Spouse Name: N/A   Number of Children: N/A   Years of Education: N/A   Occupational History   Not on file.   Social History Main Topics   Smoking status: Never Smoker    Smokeless tobacco: Never Used   Alcohol Use: 0.0 oz/week    0 Standard drinks or equivalent per week     Comment: drinks 12 packs of beer a week   Drug Use: No   Sexual Activity: Not on file   Other Topics Concern   Not on file   Social History Narrative    Review of Systems: Constitutional: negative for chills, fever, night sweats, weight changes, or fatigue  HEENT: negative for vision changes, hearing loss,  congestion, rhinorrhea, ST, epistaxis, or sinus pressure Cardiovascular: negative for chest pain or palpitations Respiratory: negative for hemoptysis, wheezing, shortness of breath, or cough Abdominal: negative for abdominal pain, nausea, vomiting, diarrhea, or constipation Dermatological: negative for rash Msk: Positive for arthralgias, myalgias  Neurologic: negative for headache, dizziness, or syncope All other systems reviewed and are otherwise negative with the exception to those above and in the HPI.  Physical Exam: Blood pressure 144/90, pulse 67, temperature 98.2 F (36.8 C), temperature source Oral, resp. rate 16, height 5' 11.5" (1.816 m), weight 248 lb (112.492 kg), SpO2 96 %., Body mass index is 34.11 kg/(m^2). General: Well developed, well nourished, in no acute distress, very  muscular individual. Head: Normocephalic, atraumatic, eyes without discharge, sclera non-icteric, nares are without discharge. Bilateral auditory canals clear, TM's are without perforation, pearly grey and translucent with reflective cone of light bilaterally. Oral cavity moist, posterior pharynx without exudate, erythema, peritonsillar abscess, or post nasal drip.  Neck: Supple. No thyromegaly. Full ROM. No lymphadenopathy. Lungs: Clear bilaterally to auscultation without wheezes, rales, or rhonchi. Breathing is unlabored. Heart: RRR with S1 S2. No murmurs, rubs, or gallops appreciated. Abdomen: Soft, non-tender, non-distended with normoactive bowel sounds. No hepatomegaly. No rebound/guarding. No obvious abdominal masses. Msk:  Strength and tone normal for age. Tender on his medial left epicondyl  Extremities/Skin: Warm and dry. No clubbing or cyanosis. No edema. No rashes or suspicious lesions. Neuro: Alert and oriented X 3. Moves all extremities spontaneously. Gait is normal. CNII-XII grossly in tact. Psych:  Responds to questions appropriately with a normal affect.   Labs: Results for orders placed or performed during the hospital encounter of 04/11/12  Comprehensive metabolic panel  Result Value Ref Range   Sodium 139 135 - 145 mEq/L   Potassium 4.1 3.5 - 5.1 mEq/L   Chloride 100 96 - 112 mEq/L   CO2 29 19 - 32 mEq/L   Glucose, Bld 100 (H) 70 - 99 mg/dL   BUN 10 6 - 23 mg/dL   Creatinine, Ser 1.611.07 0.50 - 1.35 mg/dL   Calcium 09.610.0 8.4 - 04.510.5 mg/dL   Total Protein 7.9 6.0 - 8.3 g/dL   Albumin 4.2 3.5 - 5.2 g/dL   AST 30 0 - 37 U/L   ALT 47 0 - 53 U/L   Alkaline Phosphatase 65 39 - 117 U/L   Total Bilirubin 0.2 (L) 0.3 - 1.2 mg/dL   GFR calc non Af Amer 82 (L) >90 mL/min   GFR calc Af Amer >90 >90 mL/min  Urinalysis, Routine w reflex microscopic  Result Value Ref Range   Color, Urine YELLOW YELLOW   APPearance CLEAR CLEAR   Specific Gravity, Urine 1.022 1.005 - 1.030   pH 6.0  5.0 - 8.0   Glucose, UA NEGATIVE NEGATIVE mg/dL   Hgb urine dipstick NEGATIVE NEGATIVE   Bilirubin Urine NEGATIVE NEGATIVE   Ketones, ur NEGATIVE NEGATIVE mg/dL   Protein, ur NEGATIVE NEGATIVE mg/dL   Urobilinogen, UA 0.2 0.0 - 1.0 mg/dL   Nitrite NEGATIVE NEGATIVE   Leukocytes, UA NEGATIVE NEGATIVE  Amylase  Result Value Ref Range   Amylase 32 0 - 105 U/L  HIV rapid screen  Result Value Ref Range   Rapid HIV Screen NON REACTIVE NON REACTIVE  Hepatitis B surface antigen  Result Value Ref Range   Hepatitis B Surface Ag NEGATIVE NEGATIVE  Hepatitis c antibody (reflex)  Result Value Ref Range   HCV Ab NEGATIVE NEGATIVE  CBC with Differential  Result Value  Ref Range   WBC 10.3 4.0 - 10.5 K/uL   RBC 5.00 4.22 - 5.81 MIL/uL   Hemoglobin 16.2 13.0 - 17.0 g/dL   HCT 78.2 95.6 - 21.3 %   MCV 91.8 78.0 - 100.0 fL   MCH 32.4 26.0 - 34.0 pg   MCHC 35.3 30.0 - 36.0 g/dL   RDW 08.6 57.8 - 46.9 %   Platelets 199 150 - 400 K/uL   Neutrophils Relative % 53 43 - 77 %   Neutro Abs 5.5 1.7 - 7.7 K/uL   Lymphocytes Relative 35 12 - 46 %   Lymphs Abs 3.6 0.7 - 4.0 K/uL   Monocytes Relative 10 3 - 12 %   Monocytes Absolute 1.0 0.1 - 1.0 K/uL   Eosinophils Relative 2 0 - 5 %   Eosinophils Absolute 0.2 0.0 - 0.7 K/uL   Basophils Relative 1 0 - 1 %   Basophils Absolute 0.1 0.0 - 0.1 K/uL     ASSESSMENT AND PLAN:  49 y.o. year old male with arthralgias and myalgias, history of hyperlipidemia, and family history of heart disease. This chart was scribed in my presence and reviewed by me personally.    ICD-9-CM ICD-10-CM   1. Anxiety 300.00 F41.9 ALPRAZolam (XANAX) 0.5 MG tablet  2. Essential hypertension 401.9 I10 atenolol (TENORMIN) 50 MG tablet     nitroGLYCERIN (NITROSTAT) 0.4 MG SL tablet  3. Hyperlipidemia 272.4 E78.5 rosuvastatin (CRESTOR) 10 MG tablet     Lipid panel  4. Family history of heart disease V17.49 Z82.49 nitroGLYCERIN (NITROSTAT) 0.4 MG SL tablet     Lipid panel  5.  History of non-ST elevation myocardial infarction (NSTEMI) 412 I25.2 nitroGLYCERIN (NITROSTAT) 0.4 MG SL tablet  6. Arthralgia 719.40 M25.50 Comprehensive metabolic panel     POCT SEDIMENTATION RATE     CK     Rheumatoid factor     ANA     predniSONE (DELTASONE) 20 MG tablet      Signed, Elvina Sidle, MD 11/25/2014 11:50 AM

## 2014-11-27 ENCOUNTER — Telehealth: Payer: Self-pay | Admitting: Radiology

## 2014-11-27 DIAGNOSIS — M2550 Pain in unspecified joint: Secondary | ICD-10-CM

## 2014-11-27 LAB — ANA: Anti Nuclear Antibody(ANA): NEGATIVE

## 2014-11-27 MED ORDER — DICLOFENAC SODIUM 75 MG PO TBEC: 75.0000 mg | DELAYED_RELEASE_TABLET | Freq: Two times a day (BID) | ORAL | Status: DC

## 2014-11-27 NOTE — Telephone Encounter (Signed)
Pt states he runs a lot, does spartan mud races and such and his joints hurt a lot. He is wondering if you would call him in an anti-inflammatory to take for this pain.

## 2015-03-11 ENCOUNTER — Ambulatory Visit (INDEPENDENT_AMBULATORY_CARE_PROVIDER_SITE_OTHER): Payer: 59 | Admitting: Family Medicine

## 2015-03-11 VITALS — BP 130/74 | HR 72 | Temp 98.5°F | Resp 18 | Ht 72.0 in | Wt 248.0 lb

## 2015-03-11 DIAGNOSIS — M79671 Pain in right foot: Secondary | ICD-10-CM | POA: Diagnosis not present

## 2015-03-11 DIAGNOSIS — I10 Essential (primary) hypertension: Secondary | ICD-10-CM

## 2015-03-11 DIAGNOSIS — M25522 Pain in left elbow: Secondary | ICD-10-CM

## 2015-03-11 DIAGNOSIS — M25521 Pain in right elbow: Secondary | ICD-10-CM

## 2015-03-11 DIAGNOSIS — L409 Psoriasis, unspecified: Secondary | ICD-10-CM

## 2015-03-11 DIAGNOSIS — I252 Old myocardial infarction: Secondary | ICD-10-CM | POA: Diagnosis not present

## 2015-03-11 DIAGNOSIS — M79672 Pain in left foot: Secondary | ICD-10-CM | POA: Diagnosis not present

## 2015-03-11 DIAGNOSIS — E7800 Pure hypercholesterolemia, unspecified: Secondary | ICD-10-CM

## 2015-03-11 DIAGNOSIS — M7701 Medial epicondylitis, right elbow: Secondary | ICD-10-CM

## 2015-03-11 DIAGNOSIS — G4700 Insomnia, unspecified: Secondary | ICD-10-CM

## 2015-03-11 DIAGNOSIS — M2550 Pain in unspecified joint: Secondary | ICD-10-CM

## 2015-03-11 DIAGNOSIS — Z8249 Family history of ischemic heart disease and other diseases of the circulatory system: Secondary | ICD-10-CM

## 2015-03-11 DIAGNOSIS — F419 Anxiety disorder, unspecified: Secondary | ICD-10-CM | POA: Diagnosis not present

## 2015-03-11 DIAGNOSIS — M7702 Medial epicondylitis, left elbow: Secondary | ICD-10-CM

## 2015-03-11 MED ORDER — SERTRALINE HCL 50 MG PO TABS: 50.0000 mg | ORAL_TABLET | Freq: Every day | ORAL | Status: DC

## 2015-03-11 MED ORDER — ALPRAZOLAM 0.5 MG PO TABS: 0.5000 mg | ORAL_TABLET | Freq: Every evening | ORAL | Status: DC | PRN

## 2015-03-11 MED ORDER — ATORVASTATIN CALCIUM 20 MG PO TABS: 20.0000 mg | ORAL_TABLET | Freq: Every day | ORAL | Status: DC

## 2015-03-11 MED ORDER — DICLOFENAC SODIUM 75 MG PO TBEC: 75.0000 mg | DELAYED_RELEASE_TABLET | Freq: Two times a day (BID) | ORAL | Status: DC

## 2015-03-11 MED ORDER — NITROGLYCERIN 0.4 MG SL SUBL: SUBLINGUAL_TABLET | SUBLINGUAL | Status: DC

## 2015-03-11 MED ORDER — HYDROCORTISONE 2.5 % EX OINT: TOPICAL_OINTMENT | Freq: Two times a day (BID) | CUTANEOUS | Status: DC

## 2015-03-11 NOTE — Progress Notes (Addendum)
Subjective:    [name], male    DOB: 1966-06-11, 49 y.o.   MRN: [id number] This chart was scribed for John Simmer, MD by Littie Deeds, Medical Scribe. This patient was seen in Room 9 and the patient's care was started at 3:38 PM.   03/11/2015  Medication Refill; Foot Pain; Psoriasis; and Temporomandibular Joint Pain   HPI HPI Comments: John Esparza is a 49 y.o. male with a history of NSTEMI, psoriasis, anxiety and hypertension who presents to the [facility] for a medication refill.  Patient has been having some pain to the lateral aspect of his left> right foot for about 2 months. He thinks he may have a bone spur. His pain started a week after wearing new shoes ([employer] boots).  No swelling.  Pain has persisted even when wearing supportive tennis shoes.  Patient reports having a rash to his back which he states is due to a flare-up of psoriasis. He was diagnosed with psoriasis about 6 years ago by dermatology. He does have FMHx of psoriasis in his father and daughter. Patient has had some relief after tanning in the past, but it has started flaring up again about a year and a half ago. He last saw a dermatologist 3-4 years ago.  Requesting topical cream.  Patient also reports having some arthralgias, primarily in his feet and elbows, which he attributes to working out and sleeping in a guarded position. He had been treated with steroids and anti-inflammatory medications when he saw Dr. Milus Glazier 3-4 months ago, which did provide relief for 3-4 weeks.  Autoimmune labs were obtained at last visit; sedimentation rate, rheumatoid factor, and ANA were all negative; no previous rheumatological evaluation.    Patient has also been having some difficulty sleeping which he attributes to anxiety. He has been sleeping about 2-6 hours a night. He does have a history of ADHD but has been able to control with behavior modification; no medication for ADHD for years.  He does drink caffeine, but only in the mornings. Patient is not interested in seeing a therapist. 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 He has lost both of his parents and his brother. His 72 year old unemployed daughter recently moved back in with him. These factors have contributed to his stress. Patient denies SI.  Major work stressors with police department.  Takes Xanax sparingly which works great for insomnia.    Patient had a NSTEMI in 2011. He has not been taking any statin medications and has not been taking his NTG since his last visit. He had been on Crestor previously, but stopped due to side effects and expense. He has primarily been trying to control his health through exercise and diet. For exercise, he does various cardio exercises and interval training. Patient denies SOB or chest pain. He does not see a cardiologist and does not desire appointment for follow-up. His parents were both smokers with COPD. His mother did have some cardiac issues and had an MI, but her death was primarily due to lung issues. His brother died at 14 due to MI. He has FMHx of MI in his other brothers as well. Patient denies history of smoking.  Review of Systems  Constitutional: Negative for fever, chills, diaphoresis, activity change, appetite change and fatigue.  Eyes: Negative for visual disturbance.  Respiratory: Negative for cough and shortness of breath.   Cardiovascular: Negative for chest pain, palpitations and leg swelling.  Gastrointestinal: Negative  for nausea, vomiting, abdominal pain and diarrhea.  Endocrine: Negative for cold intolerance, heat intolerance, polydipsia, polyphagia and polyuria.  Musculoskeletal: Positive for myalgias, joint swelling, arthralgias and gait problem.  Skin: Positive for rash. Negative for color change and wound.  Neurological: Negative for dizziness, tremors, seizures, syncope, facial asymmetry, speech difficulty, weakness, light-headedness, numbness and  headaches.  Psychiatric/Behavioral: Positive for sleep disturbance. Negative for suicidal ideas, self-injury and dysphoric mood. The patient is nervous/anxious.     Past Medical History  Diagnosis Date  . NSTEMI (non-ST elevated myocardial infarction) 1.1.2011    with two overlapping drug eluting stents mid lad  . HTN (hypertension)   . Back problem   . Anxiety   . Psoriasis   . Hyperlipidemia    Past Surgical History  Procedure Laterality Date  . Disk repla      L5-S1  . Appendectomy    . Retinal detachment surgery  age 24    traumatic   Allergies  Allergen Reactions  . Demerol [Meperidine]     Irregular heartbeat    History   Social History  . Marital Status: Single    Spouse Name: N/A  . Number of Children: N/A  . Years of Education: N/A   Occupational History  . Not on file.   Social History Main Topics  . Smoking status: Never Smoker   . Smokeless tobacco: Never Used  . Alcohol Use: 0.0 oz/week    0 Standard drinks or equivalent per week     Comment: drinks 12 packs of beer a week  . Drug Use: No  . Sexual Activity: Not on file   Other Topics Concern  . Not on file   Social History Narrative   Marital status: single;       Children: 2 daughters; 1 grandson      Lives: with daughter, grandson      Employment:  Emergency planning/management officer for [employer]      Tobacco; none      Alcohol:  Weekends      Exercise:  daily   Family History  Problem Relation Age of Onset  . Hypertension Mother     with copd  . COPD Mother   . Heart disease Mother     AMI  . Heart attack Father   . Hypertension Father     with copd  . COPD Father   . Heart attack Brother     three brothers  . Heart disease Brother   . Lupus Sister     twin  . Cancer Sister     cervical cancer  . Coronary artery disease Brother     died following procedure  . Heart disease Brother   . Heart disease Brother          Objective:    BP 130/74 mmHg  Pulse 72   Temp(Src) 98.5 F (36.9 C) (Oral)  Resp 18  Ht 6' (1.829 m)  Wt 248 lb (112.492 kg)  BMI 33.63 kg/m2  SpO2 97% Physical Exam  Constitutional: He is oriented to person, place, and time. He appears well-developed and well-nourished. No distress.  HENT:  Head: Normocephalic and atraumatic.  Right Ear: External ear normal.  Left Ear: External ear normal.  Nose: Nose normal.  Mouth/Throat: Oropharynx is clear and moist. No oropharyngeal exudate.  Eyes: Conjunctivae and EOM are normal. Pupils are equal, round, and reactive to light.  Neck: Normal range of motion. Neck supple. No thyromegaly present.  Cardiovascular:  Normal rate, regular rhythm, normal heart sounds and intact distal pulses.  Exam reveals no gallop and no friction rub.   No murmur heard. Pulmonary/Chest: Effort normal and breath sounds normal. No respiratory distress. He has no wheezes. He has no rales.  Musculoskeletal: He exhibits no edema.       Right elbow: He exhibits normal range of motion, no swelling and no effusion. Tenderness found. Medial epicondyle tenderness noted. No radial head, no lateral epicondyle and no olecranon process tenderness noted.       Left elbow: He exhibits normal range of motion, no swelling, no effusion and no deformity. Tenderness found. Medial epicondyle tenderness noted. No radial head, no lateral epicondyle and no olecranon process tenderness noted.  Tenderness to the medial epicondyle bilaterally. Full extension and flexion.  Lymphadenopathy:    He has no cervical adenopathy.  Neurological: He is alert and oriented to person, place, and time. No cranial nerve deficit.  Skin: Skin is warm and dry. He is not diaphoretic.  Psychiatric: He has a normal mood and affect. His behavior is normal.  Vitals reviewed.  Results for orders placed or performed in visit on 11/25/14  Comprehensive metabolic panel  Result Value Ref Range   Sodium 139 135 - 145 mEq/L   Potassium 4.5 3.5 - 5.3 mEq/L    Chloride 102 96 - 112 mEq/L   CO2 27 19 - 32 mEq/L   Glucose, Bld 98 70 - 99 mg/dL   BUN 13 6 - 23 mg/dL   Creat 1.61 0.96 - 0.45 mg/dL   Total Bilirubin 0.3 0.2 - 1.2 mg/dL   Alkaline Phosphatase 67 39 - 117 U/L   AST 32 0 - 37 U/L   ALT 71 (H) 0 - 53 U/L   Total Protein 8.1 6.0 - 8.3 g/dL   Albumin 4.3 3.5 - 5.2 g/dL   Calcium 9.8 8.4 - 40.9 mg/dL  Lipid panel  Result Value Ref Range   Cholesterol 163 0 - 200 mg/dL   Triglycerides 811 (H) <150 mg/dL   HDL 44 >=91 mg/dL   Total CHOL/HDL Ratio 3.7 Ratio   VLDL 48 (H) 0 - 40 mg/dL   LDL Cholesterol 71 0 - 99 mg/dL  CK  Result Value Ref Range   Total CK 88 7 - 232 U/L  Rheumatoid factor  Result Value Ref Range   Rhuematoid fact SerPl-aCnc <10 <=14 IU/mL  ANA  Result Value Ref Range   Anit Nuclear Antibody(ANA) NEG NEGATIVE  POCT SEDIMENTATION RATE  Result Value Ref Range   POCT SED RATE 18 0 - 22 mm/hr       Assessment & Plan:   1. Anxiety   2. Arthralgia   3. Family history of heart disease   4. Essential hypertension   5. History of non-ST elevation myocardial infarction (NSTEMI)   6. Pure hypercholesterolemia   7. Foot pain, bilateral   8. Pain of both elbows   9. Insomnia   10. Medial epicondylitis of both elbows   11. Psoriasis     1. Anxiety: worsening related to work stressors; rx for Zoloft 50mg  qhs; refill of Xanax 0.5mg  daily PRN provided. Pt declined therapy referral. 2.  Arthralgias: persistent despite stopping statin.  Temporary relief with prednisone and Diclofenac.  Autoimmune labs negative. Pt declined referral to rheumatology. 3.  HTN: controlled; obtain labs; continue current medications. 4.  CAD/AMI/stenting: stable; refill of NTG; asymptomatic; declined referral to cardiology.  Highly recommend restarting statin therapy. 5.  Hypercholesterolemia:  stable; non-compliant with stating; rx for Lipitor provided.  Warrants statin due to previous AMI/CAD. 6.  B foot pain:  New.  Located at lateral  aspect of foot at proximal fifth metatarsal.  Pt declined referral to podiatry; declined xray.  Treat with Diclofen bid; recommend icing bid. 7. Medial epicondylitis: New. Icing bid; rx for Diclofenac. 8. Insomnia: persistent; secondary to anxiety; refill of Xanax provided. 9. Psoriasis: recurrent; rx for hydrocortisone ointment 2.5%.   Meds ordered this encounter  Medications  . sertraline (ZOLOFT) 50 MG tablet    Sig: Take 1 tablet (50 mg total) by mouth at bedtime.    Dispense:  30 tablet    Refill:  5  . ALPRAZolam (XANAX) 0.5 MG tablet    Sig: Take 1 tablet (0.5 mg total) by mouth at bedtime as needed for anxiety or sleep. For anxiety    Dispense:  30 tablet    Refill:  0  . diclofenac (VOLTAREN) 75 MG EC tablet    Sig: Take 1 tablet (75 mg total) by mouth 2 (two) times daily.    Dispense:  40 tablet    Refill:  0  . hydrocortisone 2.5 % ointment    Sig: Apply topically 2 (two) times daily.    Dispense:  30 g    Refill:  0  . nitroGLYCERIN (NITROSTAT) 0.4 MG SL tablet    Sig: DISSOLVE ONE TABLET UNDER THE TONGUE EVERY 5 MINUTES AS NEEDED FOR CHEST PAIN.  DO NOT EXCEED A TOTAL OF 3 DOSES IN 15 MINUTES    Dispense:  20 tablet    Refill:  11  . atorvastatin (LIPITOR) 20 MG tablet    Sig: Take 1 tablet (20 mg total) by mouth daily at 6 PM.    Dispense:  90 tablet    Refill:  1    Return in about 3 months (around 06/11/2015) for recheck.    I personally performed the services described in this documentation, which was scribed in my presence. The recorded information has been reviewed and considered.  Sharone Picchi Paulita Fujita, M.D. [facility] [address] [place], [place]  [address] [phone] phone [phone] fax

## 2015-03-12 LAB — CBC WITH DIFFERENTIAL/PLATELET
BASOS ABS: 0.1 10*3/uL (ref 0.0–0.1)
Basophils Relative: 1 % (ref 0–1)
Eosinophils Absolute: 0.3 10*3/uL (ref 0.0–0.7)
Eosinophils Relative: 3 % (ref 0–5)
HCT: 45.9 % (ref 39.0–52.0)
HEMOGLOBIN: 16 g/dL (ref 13.0–17.0)
Lymphocytes Relative: 46 % (ref 12–46)
Lymphs Abs: 4.2 10*3/uL — ABNORMAL HIGH (ref 0.7–4.0)
MCH: 32.7 pg (ref 26.0–34.0)
MCHC: 34.9 g/dL (ref 30.0–36.0)
MCV: 93.9 fL (ref 78.0–100.0)
MONO ABS: 0.9 10*3/uL (ref 0.1–1.0)
MPV: 9 fL (ref 8.6–12.4)
Monocytes Relative: 10 % (ref 3–12)
NEUTROS PCT: 40 % — AB (ref 43–77)
Neutro Abs: 3.6 10*3/uL (ref 1.7–7.7)
Platelets: 224 10*3/uL (ref 150–400)
RBC: 4.89 MIL/uL (ref 4.22–5.81)
RDW: 12.8 % (ref 11.5–15.5)
WBC: 9.1 10*3/uL (ref 4.0–10.5)

## 2015-03-12 LAB — LIPID PANEL
Cholesterol: 231 mg/dL — ABNORMAL HIGH (ref 125–200)
HDL: 37 mg/dL — ABNORMAL LOW (ref >=40)
LDL CALC: 137 mg/dL — AB (ref <130)
Total CHOL/HDL Ratio: 6.2 Ratio — ABNORMAL HIGH (ref <=5.0)
Triglycerides: 284 mg/dL — ABNORMAL HIGH (ref <150)
VLDL: 57 mg/dL — AB (ref <30)

## 2015-03-12 LAB — COMPREHENSIVE METABOLIC PANEL
ALBUMIN: 4.7 g/dL (ref 3.6–5.1)
ALT: 41 U/L (ref 9–46)
AST: 25 U/L (ref 10–40)
Alkaline Phosphatase: 52 U/L (ref 40–115)
BUN: 15 mg/dL (ref 7–25)
CHLORIDE: 101 mmol/L (ref 98–110)
CO2: 27 mmol/L (ref 20–31)
CREATININE: 1.05 mg/dL (ref 0.60–1.35)
Calcium: 9.6 mg/dL (ref 8.6–10.3)
Glucose, Bld: 87 mg/dL (ref 65–99)
POTASSIUM: 4.1 mmol/L (ref 3.5–5.3)
Sodium: 139 mmol/L (ref 135–146)
TOTAL PROTEIN: 8.3 g/dL — AB (ref 6.1–8.1)
Total Bilirubin: 0.4 mg/dL (ref 0.2–1.2)

## 2015-07-24 ENCOUNTER — Ambulatory Visit (INDEPENDENT_AMBULATORY_CARE_PROVIDER_SITE_OTHER): Payer: 59 | Admitting: Emergency Medicine

## 2015-07-24 VITALS — BP 118/78 | HR 64 | Temp 98.4°F | Resp 18 | Ht 72.0 in | Wt 262.3 lb

## 2015-07-24 DIAGNOSIS — M7552 Bursitis of left shoulder: Secondary | ICD-10-CM | POA: Diagnosis not present

## 2015-07-24 DIAGNOSIS — M25512 Pain in left shoulder: Secondary | ICD-10-CM | POA: Diagnosis not present

## 2015-07-24 DIAGNOSIS — L409 Psoriasis, unspecified: Secondary | ICD-10-CM | POA: Diagnosis not present

## 2015-07-24 MED ORDER — NAPROXEN SODIUM 550 MG PO TABS: 550.0000 mg | ORAL_TABLET | Freq: Two times a day (BID) | ORAL | Status: DC

## 2015-07-24 MED ORDER — METHYLPREDNISOLONE ACETATE 80 MG/ML IJ SUSP
80.0000 mg | Freq: Once | INTRAMUSCULAR | Status: DC
Start: 2015-07-24 — End: 2016-06-24

## 2015-07-24 MED ORDER — CLOBETASOL PROPIONATE 0.05 % EX OINT: 1.0000 "application " | TOPICAL_OINTMENT | Freq: Two times a day (BID) | CUTANEOUS | Status: DC

## 2015-07-24 NOTE — Patient Instructions (Signed)
Bursitis °Bursitis is inflammation and irritation of a bursa, which is one of the small, fluid-filled sacs that cushion and protect the moving parts of your body. These sacs are located between bones and muscles, muscle attachments, or skin areas next to bones. A bursa protects these structures from the wear and tear that results from frequent movement. °An inflamed bursa causes pain and swelling. Fluid may build up inside the sac. Bursitis is most common near joints, especially the knees, elbows, hips, and shoulders. °CAUSES °Bursitis can be caused by:  °· Injury from: °¨ A direct blow, like falling on your knee or elbow. °¨ Overuse of a joint (repetitive stress). °· Infection. This can happen if bacteria gets into a bursa through a cut or scrape near a joint. °· Diseases that cause joint inflammation, such as gout and rheumatoid arthritis. °RISK FACTORS °You may be at risk for bursitis if you:  °· Have a job or hobby that involves a lot of repetitive stress on your joints. °· Have a condition that weakens your body's defense system (immune system), such as diabetes, cancer, or HIV. °· Lift and reach overhead often. °· Kneel or lean on hard surfaces often. °· Run or walk often. °SIGNS AND SYMPTOMS °The most common signs and symptoms of bursitis are: °· Pain that gets worse when you move the affected body part or put weight on it. °· Inflammation. °· Stiffness. °Other signs and symptoms may include: °· Redness. °· Tenderness. °· Warmth. °· Pain that continues after rest. °· Fever and chills. This may occur in bursitis caused by infection. °DIAGNOSIS °Bursitis may be diagnosed by:  °· Medical history and physical exam. °· MRI. °· A procedure to drain fluid from the bursa with a needle (aspiration). The fluid may be checked for signs of infection or gout. °· Blood tests to rule out other causes of inflammation. °TREATMENT  °Bursitis can usually be treated at home with rest, ice, compression, and elevation (RICE). For  mild bursitis, RICE treatment may be all you need. Other treatments may include: °· Nonsteroidal anti-inflammatory drugs (NSAIDs) to treat pain and inflammation. °· Corticosteroids to fight inflammation. You may have these drugs injected into and around the area of bursitis. °· Aspiration of bursitis fluid to relieve pain and improve movement. °· Antibiotic medicine to treat an infected bursa. °· A splint, brace, or walking aid. °· Physical therapy if you continue to have pain or limited movement. °· Surgery to remove a damaged or infected bursa. This may be needed if you have a very bad case of bursitis or if other treatments have not worked. °HOME CARE INSTRUCTIONS  °· Take medicines only as directed by your health care provider. °· If you were prescribed an antibiotic medicine, finish it all even if you start to feel better. °· Rest the affected area as directed by your health care provider. °¨ Keep the area elevated. °¨ Avoid activities that make pain worse. °· Apply ice to the injured area: °¨ Place ice in a plastic bag. °¨ Place a towel between your skin and the bag. °¨ Leave the ice on for 20 minutes, 2-3 times a day. °· Use splints, braces, pads, or walking aids as directed by your health care provider. °· Keep all follow-up visits as directed by your health care provider. This is important. °PREVENTION  °· Wear knee pads if you kneel often. °· Wear sturdy running or walking shoes that fit you well. °· Take regular breaks from repetitive activity. °· Warm   up by stretching before doing any strenuous activity. °· Maintain a healthy weight or lose weight as recommended by your health care provider. Ask your health care provider if you need help. °· Exercise regularly. Start any new physical activity gradually. °SEEK MEDICAL CARE IF:  °· Your bursitis is not responding to treatment or home care. °· You have a fever. °· You have chills. °  °This information is not intended to replace advice given to you by your  health care provider. Make sure you discuss any questions you have with your health care provider. °  °Document Released: 07/25/2000 Document Revised: 04/18/2015 Document Reviewed: 10/17/2013 °Elsevier Interactive Patient Education ©2016 [employer] Inc[employer] ° °

## 2015-07-24 NOTE — Addendum Note (Signed)
Addended by: Carmelina DaneANDERSON, Fabion Gatson S on: 07/24/2015 10:56 AM   Modules accepted: Kipp BroodSmartSet

## 2015-07-24 NOTE — Progress Notes (Addendum)
Subjective:  [name], male    DOB: [birth year]  Age: 49 y.o. MRN: [id number]  CC: Shoulder Pain   HPI John Esparza presents  patients a who works out daily at [employer] and did upper body conditioning on Sunday Monday morning woke up with marked pain in his left shoulder located the anterior shoulder. He did deny working out heavy weights did more resistance training. He has no radiation of pain numbness tingling or weakness pain is worse with movement of the shoulder. He denies any swelling or ecchymosis  History [name] has a past medical history of NSTEMI (non-ST elevated myocardial infarction) (HCC) (1.1.2011); HTN (hypertension); Back problem; Anxiety; Psoriasis; and Hyperlipidemia.   He has past surgical history that includes disk repla; Appendectomy; and Retinal detachment surgery (age 81).   His  family history includes COPD in his father and mother; Cancer in his sister; Coronary artery disease in his brother; Heart attack in his brother and father; Heart disease in his brother, brother, brother, and mother; Hypertension in his father and mother; Lupus in his sister.  He   reports that he has never smoked. He has never used smokeless tobacco. He reports that he drinks alcohol. He reports that he does not use illicit drugs.  Outpatient Prescriptions Prior to Visit  Medication Sig Dispense Refill  . ALPRAZolam (XANAX) 0.5 MG tablet Take 1 tablet (0.5 mg total) by mouth at bedtime as needed for anxiety or sleep. For anxiety 30 tablet 0  . aspirin 325 MG EC tablet Take 325 mg by mouth daily.    Marland Kitchen atenolol (TENORMIN) 50 MG tablet Take 1 tablet (50 mg total) by mouth daily. 90 tablet 3  . atorvastatin (LIPITOR) 20 MG tablet Take 1 tablet (20 mg total) by mouth daily at 6 PM. 90 tablet 1  . diclofenac (VOLTAREN) 75 MG EC tablet Take 1 tablet (75 mg total) by mouth 2 (two) times daily. 40 tablet 0  . hydrocortisone 2.5 % ointment Apply topically 2 (two) times daily. 30 g  0  . nitroGLYCERIN (NITROSTAT) 0.4 MG SL tablet DISSOLVE ONE TABLET UNDER THE TONGUE EVERY 5 MINUTES AS NEEDED FOR CHEST PAIN.  DO NOT EXCEED A TOTAL OF 3 DOSES IN 15 MINUTES 20 tablet 11  . sertraline (ZOLOFT) 50 MG tablet Take 1 tablet (50 mg total) by mouth at bedtime. 30 tablet 5   No facility-administered medications prior to visit.    Social History   Social History  . Marital Status: Single    Spouse Name: N/A  . Number of Children: N/A  . Years of Education: N/A   Social History Main Topics  . Smoking status: Never Smoker   . Smokeless tobacco: Never Used  . Alcohol Use: 0.0 oz/week    0 Standard drinks or equivalent per week     Comment: drinks 12 packs of beer a week  . Drug Use: No  . Sexual Activity: Not Asked   Other Topics Concern  . None   Social History Narrative   Marital status: single;       Children: 2 daughters; 1 grandson      Lives: with daughter, grandson      Employment:  Emergency planning/management officer for [employer]      Tobacco; none      Alcohol:  Weekends      Exercise:  daily     Review of Systems  Constitutional: Negative for fever, chills and appetite change.  HENT: Negative for congestion, ear pain, postnasal drip, sinus pressure and sore throat.   Eyes: Negative for pain and redness.  Respiratory: Negative for cough, shortness of breath and wheezing.   Cardiovascular: Negative for leg swelling.  Gastrointestinal: Negative for nausea, vomiting, abdominal pain, diarrhea, constipation and blood in stool.  Endocrine: Negative for polyuria.  Genitourinary: Negative for dysuria, urgency, frequency and flank pain.  Musculoskeletal: Negative for gait problem.  Skin: Negative for rash.  Neurological: Negative for weakness and headaches.  Psychiatric/Behavioral: Negative for confusion and decreased concentration. The patient is not nervous/anxious.     Objective:  BP 118/78 mmHg  Pulse 64  Temp(Src) 98.4 F (36.9 C) (Oral)  Resp  18  Ht 6' (1.829 m)  Wt 262 lb 4.8 oz (118.978 kg)  BMI 35.57 kg/m2  SpO2 98%  Physical Exam  Constitutional: He is oriented to person, place, and time. He appears well-developed and well-nourished.  HENT:  Head: Normocephalic and atraumatic.  Eyes: Conjunctivae are normal. Pupils are equal, round, and reactive to light.  Pulmonary/Chest: Effort normal.  Musculoskeletal: He exhibits no edema.  Neurological: He is alert and oriented to person, place, and time.  Skin: Skin is dry.  Psychiatric: He has a normal mood and affect. His behavior is normal. Thought content normal.   he has marked tenderness anterior shoulder joint. Full active and passive range of motion  Assessment & Plan:   There are no diagnoses linked to this encounter. I am having John Esparza maintain his aspirin, atenolol, sertraline, ALPRAZolam, diclofenac, hydrocortisone, nitroGLYCERIN, and atorvastatin.  No orders of the defined types were placed in this encounter.   He was injected with 60 mg of Depo-Medrol 2 mL of Marcaine in the anterior shoulder joint and he had good range of motion and improved pain with the medication. He'll follow-up in a as needed he was also referred to rheumatology as he is concerned that this may be an exacerbation of psoriatic arthritis  Appropriate red flag conditions were discussed with the patient as well as actions that should be taken.  Patient expressed his understanding.  Follow-up: No Follow-up on file.  Carmelina DaneAnderson, Jeffery S, MD

## 2015-10-22 ENCOUNTER — Ambulatory Visit (INDEPENDENT_AMBULATORY_CARE_PROVIDER_SITE_OTHER): Payer: 59 | Admitting: Family Medicine

## 2015-10-22 ENCOUNTER — Ambulatory Visit (INDEPENDENT_AMBULATORY_CARE_PROVIDER_SITE_OTHER): Payer: 59

## 2015-10-22 VITALS — BP 122/76 | HR 73 | Temp 98.6°F | Resp 16 | Ht 73.0 in | Wt 250.0 lb

## 2015-10-22 DIAGNOSIS — K921 Melena: Secondary | ICD-10-CM

## 2015-10-22 DIAGNOSIS — K529 Noninfective gastroenteritis and colitis, unspecified: Secondary | ICD-10-CM

## 2015-10-22 DIAGNOSIS — R197 Diarrhea, unspecified: Secondary | ICD-10-CM

## 2015-10-22 LAB — COMPREHENSIVE METABOLIC PANEL
ALK PHOS: 49 U/L (ref 40–115)
ALT: 44 U/L (ref 9–46)
AST: 29 U/L (ref 10–40)
Albumin: 4.2 g/dL (ref 3.6–5.1)
BILIRUBIN TOTAL: 0.4 mg/dL (ref 0.2–1.2)
BUN: 9 mg/dL (ref 7–25)
CHLORIDE: 103 mmol/L (ref 98–110)
CO2: 27 mmol/L (ref 20–31)
CREATININE: 0.86 mg/dL (ref 0.60–1.35)
Calcium: 9 mg/dL (ref 8.6–10.3)
Glucose, Bld: 95 mg/dL (ref 65–99)
POTASSIUM: 4.2 mmol/L (ref 3.5–5.3)
Sodium: 139 mmol/L (ref 135–146)
Total Protein: 7.5 g/dL (ref 6.1–8.1)

## 2015-10-22 LAB — POCT URINALYSIS DIP (MANUAL ENTRY)
Bilirubin, UA: NEGATIVE
Glucose, UA: NEGATIVE
Ketones, POC UA: NEGATIVE
Leukocytes, UA: NEGATIVE
Nitrite, UA: NEGATIVE
PH UA: 5.5
RBC UA: NEGATIVE
SPEC GRAV UA: 1.025
Urobilinogen, UA: 0.2

## 2015-10-22 LAB — POC MICROSCOPIC URINALYSIS (UMFC): Mucus: ABSENT

## 2015-10-22 LAB — POCT CBC
GRANULOCYTE PERCENT: 52.2 % (ref 37–80)
HEMATOCRIT: 44.8 % (ref 43.5–53.7)
Hemoglobin: 16.1 g/dL (ref 14.1–18.1)
Lymph, poc: 2.7 (ref 0.6–3.4)
MCH, POC: 32.9 pg — AB (ref 27–31.2)
MCHC: 35.8 g/dL — AB (ref 31.8–35.4)
MCV: 91.8 fL (ref 80–97)
MID (cbc): 0.7 (ref 0–0.9)
MPV: 6.2 fL (ref 0–99.8)
POC GRANULOCYTE: 3.8 (ref 2–6.9)
POC LYMPH PERCENT: 38 %L (ref 10–50)
POC MID %: 9.8 %M (ref 0–12)
Platelet Count, POC: 245 10*3/uL (ref 142–424)
RBC: 4.88 M/uL (ref 4.69–6.13)
RDW, POC: 12.4 %
WBC: 7.2 10*3/uL (ref 4.6–10.2)

## 2015-10-22 LAB — HEMOCCULT GUIAC POC 1CARD (OFFICE): Fecal Occult Blood, POC: NEGATIVE

## 2015-10-22 MED ORDER — LOPERAMIDE HCL 2 MG PO TABS: 2.0000 mg | ORAL_TABLET | Freq: Four times a day (QID) | ORAL | Status: DC | PRN

## 2015-10-22 MED ORDER — MELOXICAM 7.5 MG PO TABS: 7.5000 mg | ORAL_TABLET | Freq: Two times a day (BID) | ORAL | Status: DC | PRN

## 2015-10-22 MED ORDER — OMEPRAZOLE 40 MG PO CPDR: 40.0000 mg | DELAYED_RELEASE_CAPSULE | Freq: Every day | ORAL | Status: DC

## 2015-10-22 NOTE — Patient Instructions (Addendum)
IF you received an x-ray today, you will receive an invoice from [facility]. Please contact [facility] at [phone] with questions or concerns regarding your invoice.   IF you received labwork today, you will receive an invoice from [facility]. Please contact [facility] at [phone] with questions or concerns regarding your invoice.   Our billing staff will not be able to assist you with questions regarding bills from these companies.  You will be contacted with the lab results as soon as they are available. The fastest way to get your results is to activate your My Chart account. Instructions are located on the last page of this paperwork. If you have not heard from us regarding the results in 2 weeks, please contact this office. Food Choices to Help Relieve Diarrhea, Adult When you have diarrhea, the foods you eat and your eating habits are very important. Choosing the right foods and drinks can help relieve diarrhea. Also, because diarrhea can last up to 7 days, you need to replace lost fluids and electrolytes (such as sodium, potassium, and chloride) in order to help prevent dehydration.  WHAT GENERAL GUIDELINES DO I NEED TO FOLLOW?  Slowly drink 1 cup (8 oz) of fluid for each episode of diarrhea. If you are getting enough fluid, your urine will be clear or pale yellow.  Eat starchy foods. Some good choices include white rice, white toast, pasta, low-fiber cereal, baked potatoes (without the skin), saltine crackers, and bagels.  Avoid large servings of any cooked vegetables.  Limit fruit to two servings per day. A serving is  cup or 1 small piece.  Choose foods with less than 2 g of fiber per serving.  Limit fats to less than 8 tsp (38 g) per day.  Avoid fried foods.  Eat foods that have probiotics in them. Probiotics can be found in certain dairy products.  Avoid foods and beverages that may increase the speed at which food moves  through the stomach and intestines (gastrointestinal tract). Things to avoid include:  High-fiber foods, such as dried fruit, raw fruits and vegetables, nuts, seeds, and whole grain foods.  Spicy foods and high-fat foods.  Foods and beverages sweetened with high-fructose corn syrup, honey, or sugar alcohols such as xylitol, sorbitol, and mannitol. WHAT FOODS ARE RECOMMENDED? Grains White rice. White, [place], or pita breads (fresh or toasted), including plain rolls, buns, or bagels. White pasta. Saltine, soda, or graham crackers. Pretzels. Low-fiber cereal. Cooked cereals made with water (such as cornmeal, farina, or cream cereals). Plain muffins. Matzo. Melba toast. Zwieback.  Vegetables Potatoes (without the skin). Strained tomato and vegetable juices. Most well-cooked and canned vegetables without seeds. Tender lettuce. Fruits Cooked or canned applesauce, apricots, cherries, fruit cocktail, grapefruit, peaches, pears, or plums. Fresh bananas, apples without skin, cherries, grapes, cantaloupe, grapefruit, peaches, oranges, or plums.  Meat and Other Protein Products Baked or boiled chicken. Eggs. Tofu. Fish. Seafood. Smooth peanut butter. Ground or well-cooked tender beef, ham, veal, lamb, pork, or poultry.  Dairy Plain yogurt, kefir, and unsweetened liquid yogurt. Lactose-free milk, buttermilk, or soy milk. Plain hard cheese. Beverages Sport drinks. Clear broths. Diluted fruit juices (except prune). Regular, caffeine-free sodas such as ginger ale. Water. Decaffeinated teas. Oral rehydration solutions. Sugar-free beverages not sweetened with sugar alcohols. Other Bouillon, broth, or soups made from recommended foods.  The items listed above may not be a complete list of recommended foods or beverages. Contact your dietitian for more options. WHAT FOODS ARE NOT RECOMMENDED? Grains Whole  grain, whole wheat, bran, or rye breads, rolls, pastas, crackers, and cereals. Wild or brown rice. Cereals  that contain more than 2 g of fiber per serving. Corn tortillas or taco shells. Cooked or dry oatmeal. Granola. Popcorn. Vegetables Raw vegetables. Cabbage, broccoli, Brussels sprouts, artichokes, baked beans, beet greens, corn, kale, legumes, peas, sweet potatoes, and yams. Potato skins. Cooked spinach and cabbage. Fruits Dried fruit, including raisins and dates. Raw fruits. Stewed or dried prunes. Fresh apples with skin, apricots, mangoes, pears, raspberries, and strawberries.  Meat and Other Protein Products Chunky peanut butter. Nuts and seeds. Beans and lentils. Tomasa BlaseBacon.  Dairy High-fat cheeses. Milk, chocolate milk, and beverages made with milk, such as milk shakes. Cream. Ice cream. Sweets and Desserts Sweet rolls, doughnuts, and sweet breads. Pancakes and waffles. Fats and Oils Butter. Cream sauces. Margarine. Salad oils. Plain salad dressings. Olives. Avocados.  Beverages Caffeinated beverages (such as coffee, tea, soda, or energy drinks). Alcoholic beverages. Fruit juices with pulp. Prune juice. Soft drinks sweetened with high-fructose corn syrup or sugar alcohols. Other Coconut. Hot sauce. Chili powder. Mayonnaise. Gravy. Cream-based or milk-based soups.  The items listed above may not be a complete list of foods and beverages to avoid. Contact your dietitian for more information. WHAT SHOULD I DO IF I BECOME DEHYDRATED? Diarrhea can sometimes lead to dehydration. Signs of dehydration include dark urine and dry mouth and skin. If you think you are dehydrated, you should rehydrate with an oral rehydration solution. These solutions can be purchased at pharmacies, retail stores, or online.  Drink -1 cup (120-240 mL) of oral rehydration solution each time you have an episode of diarrhea. If drinking this amount makes your diarrhea worse, try drinking smaller amounts more often. For example, drink 1-3 tsp (5-15 mL) every 5-10 minutes.  A general rule for staying hydrated is to drink 1-2 L  of fluid per day. Talk to your health care provider about the specific amount you should be drinking each day. Drink enough fluids to keep your urine clear or pale yellow.   This information is not intended to replace advice given to you by your health care provider. Make sure you discuss any questions you have with your health care provider.   Document Released: 10/18/2003 Document Revised: 08/18/2014 Document Reviewed: 06/20/2013 Elsevier Interactive Patient Education 2016 Elsevier Inc.   Viral Gastroenteritis Viral gastroenteritis is also known as stomach flu. This condition affects the stomach and intestinal tract. It can cause sudden diarrhea and vomiting. The illness typically lasts 3 to 8 days. Most people develop an immune response that eventually gets rid of the virus. While this natural response develops, the virus can make you quite ill. CAUSES  Many different viruses can cause gastroenteritis, such as rotavirus or noroviruses. You can catch one of these viruses by consuming contaminated food or water. You may also catch a virus by sharing utensils or other personal items with an infected person or by touching a contaminated surface. SYMPTOMS  The most common symptoms are diarrhea and vomiting. These problems can cause a severe loss of body fluids (dehydration) and a body salt (electrolyte) imbalance. Other symptoms may include:  Fever.  Headache.  Fatigue.  Abdominal pain. DIAGNOSIS  Your caregiver can usually diagnose viral gastroenteritis based on your symptoms and a physical exam. A stool sample may also be taken to test for the presence of viruses or other infections. TREATMENT  This illness typically goes away on its own. Treatments are aimed at rehydration. The most serious  cases of viral gastroenteritis involve vomiting so severely that you are not able to keep fluids down. In these cases, fluids must be given through an intravenous line (IV). HOME CARE INSTRUCTIONS    Drink enough fluids to keep your urine clear or pale yellow. Drink small amounts of fluids frequently and increase the amounts as tolerated.  Ask your caregiver for specific rehydration instructions.  Avoid:  Foods high in sugar.  Alcohol.  Carbonated drinks.  Tobacco.  Juice.  Caffeine drinks.  Extremely hot or cold fluids.  Fatty, greasy foods.  Too much intake of anything at one time.  Dairy products until 24 to 48 hours after diarrhea stops.  You may consume probiotics. Probiotics are active cultures of beneficial bacteria. They may lessen the amount and number of diarrheal stools in adults. Probiotics can be found in yogurt with active cultures and in supplements.  Wash your hands well to avoid spreading the virus.  Only take over-the-counter or prescription medicines for pain, discomfort, or fever as directed by your caregiver. Do not give aspirin to children. Antidiarrheal medicines are not recommended.  Ask your caregiver if you should continue to take your regular prescribed and over-the-counter medicines.  Keep all follow-up appointments as directed by your caregiver. SEEK IMMEDIATE MEDICAL CARE IF:   You are unable to keep fluids down.  You do not urinate at least once every 6 to 8 hours.  You develop shortness of breath.  You notice blood in your stool or vomit. This may look like coffee grounds.  You have abdominal pain that increases or is concentrated in one small area (localized).  You have persistent vomiting or diarrhea.  You have a fever.  The patient is a child younger than 3 months, and he or she has a fever.  The patient is a child older than 3 months, and he or she has a fever and persistent symptoms.  The patient is a child older than 3 months, and he or she has a fever and symptoms suddenly get worse.  The patient is a baby, and he or she has no tears when crying. MAKE SURE YOU:   Understand these instructions.  Will watch  your condition.  Will get help right away if you are not doing well or get worse.   This information is not intended to replace advice given to you by your health care provider. Make sure you discuss any questions you have with your health care provider.   Document Released: 07/28/2005 Document Revised: 10/20/2011 Document Reviewed: 05/14/2011 Elsevier Interactive Patient Education [employer]

## 2015-10-22 NOTE — Progress Notes (Signed)
Subjective:   By signing my name below, I, Raven Small, attest that this documentation has been prepared under the direction and in the presence of Norberto Sorenson, MD.  Electronically Signed: Andrew Au, ED Scribe. 10/22/2015. 12:19 PM.  [name], male    DOB: 08-23-1965, 50 y.o.   MRN: [id number]  HPI   Chief Complaint  Patient presents with  . Abdominal Pain    x 2 days  . Diarrhea    tarred  . Fatigue    HPI Comments: John Esparza is a 50 y.o. male who presents to the [facility] complaining of dark stools that began 2 days ago. Pt reports symptoms started with weakness and fatigue 3 days ago. The following day he began to have a BM every hour consisting of loose dark, black stools. He's had 2 BM today of dark stools. He also states he feels some light headedness and dizziness with standing. He took OTC medication without relief to symptoms. He has been taking Aleve for a joint pain and cold symptoms but has not taken medication in about a week. Pt reports similar symptoms 1 month ago after visiting a winery. He did indulge in wine and cheese while there. At that time he had not taken medication for symptoms and symptoms resolved on their own. He has not had to see GI in the past. He denies fever, chills, nausea and abdominal pain.    Patient Active Problem List   Diagnosis Date Noted  . CAD, NATIVE VESSEL 09/21/2009  . HYPERTENSION 09/10/2009  . MYOCARDIAL INFARCTION, ACUTE, SUBENDOCARDIAL 09/10/2009  . CHEST PAIN 09/10/2009  . NEPHROLITHIASIS, HX OF 09/10/2009   Past Medical History  Diagnosis Date  . NSTEMI (non-ST elevated myocardial infarction) (HCC) 1.1.2011    with two overlapping drug eluting stents mid lad  . HTN (hypertension)   . Back problem   . Anxiety   . Psoriasis   . Hyperlipidemia    Past Surgical History  Procedure Laterality Date  . Disk repla      L5-S1  . Appendectomy    . Retinal detachment surgery  age 46   traumatic   Allergies  Allergen Reactions  . Demerol [Meperidine]     Irregular heartbeat    Prior to Admission medications   Medication Sig Start Date End Date Taking? Authorizing Provider  aspirin 325 MG EC tablet Take 325 mg by mouth daily.   Yes Historical Provider, MD  atenolol (TENORMIN) 50 MG tablet Take 1 tablet (50 mg total) by mouth daily. 11/25/14  Yes Elvina Sidle, MD  atorvastatin (LIPITOR) 20 MG tablet Take 1 tablet (20 mg total) by mouth daily at 6 PM. 03/11/15  Yes Ethelda Chick, MD  ALPRAZolam Prudy Feeler) 0.5 MG tablet Take 1 tablet (0.5 mg total) by mouth at bedtime as needed for anxiety or sleep. For anxiety Patient not taking: Reported on 10/22/2015 03/11/15   Ethelda Chick, MD  clobetasol ointment (TEMOVATE) 0.05 % Apply 1 application topically 2 (two) times daily. Patient not taking: Reported on 10/22/2015 07/24/15   Carmelina Dane, MD  diclofenac (VOLTAREN) 75 MG EC tablet Take 1 tablet (75 mg total) by mouth 2 (two) times daily. Patient not taking: Reported on 10/22/2015 03/11/15   Ethelda Chick, MD  hydrocortisone 2.5 % ointment Apply topically 2 (two) times daily. Patient not taking: Reported on 10/22/2015 03/11/15   Ethelda Chick, MD  naproxen sodium (ANAPROX DS) 550 MG  tablet Take 1 tablet (550 mg total) by mouth 2 (two) times daily with a meal. Patient not taking: Reported on 10/22/2015 07/24/15 07/23/16  Carmelina Dane, MD  nitroGLYCERIN (NITROSTAT) 0.4 MG SL tablet DISSOLVE ONE TABLET UNDER THE TONGUE EVERY 5 MINUTES AS NEEDED FOR CHEST PAIN.  DO NOT EXCEED A TOTAL OF 3 DOSES IN 15 MINUTES Patient not taking: Reported on 10/22/2015 03/11/15   Ethelda Chick, MD  sertraline (ZOLOFT) 50 MG tablet Take 1 tablet (50 mg total) by mouth at bedtime. Patient not taking: Reported on 10/22/2015 03/11/15   Ethelda Chick, MD   Social History   Social History  . Marital Status: Single    Spouse Name: N/A  . Number of Children: N/A  . Years of Education: N/A    Occupational History  . Not on file.   Social History Main Topics  . Smoking status: Never Smoker   . Smokeless tobacco: Never Used  . Alcohol Use: 0.0 oz/week    0 Standard drinks or equivalent per week     Comment: drinks 12 packs of beer a week  . Drug Use: No  . Sexual Activity: Not on file   Other Topics Concern  . Not on file   Social History Narrative   Marital status: single;       Children: 2 daughters; 1 grandson      Lives: with daughter, grandson      Employment:  Emergency planning/management officer for [employer]      Tobacco; none      Alcohol:  Weekends      Exercise:  daily   Review of Systems  Constitutional: Positive for fatigue. Negative for fever, chills, diaphoresis, activity change and appetite change.  Respiratory: Negative for shortness of breath.   Cardiovascular: Negative for chest pain.  Gastrointestinal: Positive for abdominal pain and diarrhea. Negative for nausea, vomiting, constipation, blood in stool, abdominal distention, anal bleeding and rectal pain.  Genitourinary: Negative for dysuria, frequency and decreased urine volume.  Neurological: Positive for dizziness and light-headedness.  Hematological: Negative for adenopathy.   Objective:   Physical Exam  Constitutional: He is oriented to person, place, and time. He appears well-developed and well-nourished. No distress.  HENT:  Head: Normocephalic and atraumatic.  Eyes: Conjunctivae and EOM are normal.  Neck: Neck supple.  Cardiovascular: Normal rate, regular rhythm and normal heart sounds.   No murmur heard. Pulmonary/Chest: Effort normal.  Abdominal: Soft. He exhibits no mass. Bowel sounds are increased. There is no tenderness. There is no guarding.  Genitourinary: Rectum normal and prostate normal. Rectal exam shows no external hemorrhoid, no internal hemorrhoid, no fissure and anal tone normal. Prostate is not enlarged.  Normal anal. Normal rectal tone. Normal prostate. Trace  amount of cold black stool.  Musculoskeletal: Normal range of motion.  No CVA tenderness  Neurological: He is alert and oriented to person, place, and time.  Skin: Skin is warm and dry.  Psychiatric: He has a normal mood and affect. His behavior is normal.  Nursing note and vitals reviewed.  Filed Vitals:   10/22/15 1133  BP: 122/76  Pulse: 73  Temp: 98.6 F (37 C)  Resp: 16  Height: 6\' 1"  (1.854 m)  Weight: 250 lb (113.399 kg)  SpO2: 98%   Results for orders placed or performed in visit on 10/22/15  POCT CBC  Result Value Ref Range   WBC 7.2 4.6 - 10.2 K/uL   Lymph, poc 2.7 0.6 -  3.4   POC LYMPH PERCENT 38.0 10 - 50 %L   MID (cbc) 0.7 0 - 0.9   POC MID % 9.8 0 - 12 %M   POC Granulocyte 3.8 2 - 6.9   Granulocyte percent 52.2 37 - 80 %G   RBC 4.88 4.69 - 6.13 M/uL   Hemoglobin 16.1 14.1 - 18.1 g/dL   HCT, POC 40.944.8 81.143.5 - 53.7 %   MCV 91.8 80 - 97 fL   MCH, POC 32.9 (A) 27 - 31.2 pg   MCHC 35.8 (A) 31.8 - 35.4 g/dL   RDW, POC 91.412.4 %   Platelet Count, POC 245 142 - 424 K/uL   MPV 6.2 0 - 99.8 fL  POCT urinalysis dipstick  Result Value Ref Range   Color, UA yellow yellow   Clarity, UA clear clear   Glucose, UA negative negative   Bilirubin, UA negative negative   Ketones, POC UA negative negative   Spec Grav, UA 1.025    Blood, UA negative negative   pH, UA 5.5    Protein Ur, POC =30 (A) negative   Urobilinogen, UA 0.2    Nitrite, UA Negative Negative   Leukocytes, UA Negative Negative  POCT Microscopic Urinalysis ([facility])  Result Value Ref Range   WBC,UR,HPF,POC None None WBC/hpf   RBC,UR,HPF,POC None None RBC/hpf   Bacteria None None, Too numerous to count   Mucus Absent Absent   Epithelial Cells, UR Per Microscopy None None, Too numerous to count cells/hpf  POCT occult blood stool  Result Value Ref Range   Fecal Occult Blood, POC Negative Negative   Card #1 Date 10/22/2015    Card #2 Fecal Occult Blod, POC     Card #2 Date     Card #3 Fecal Occult Blood,  POC     Card #3 Date      Assessment & Plan:   1. Melena   2. Diarrhea, unspecified type   3. Gastroenteritis   Stool is dark black but HOC was NEGATIVE - will have pt repeat at home to ensure it remains so but suspect color is due to come ingestion (though pt feels confident the peptobismol he took was AFTER his GI/stool changes started).  Collect stool studies, then ok to start low dose prn antidiarrheals.  Start probiotics and ppi.  Orders Placed This Encounter  Procedures  . Clostridium Difficile by PCR    Order Specific Question:  Is your patient experiencing loose or watery stools (3 or more in 24 hours)?    Answer:  Yes    Order Specific Question:  Has the patient received laxatives in the last 24 hours?    Answer:  No    Order Specific Question:  Has a negative Cdiff test resulted in the last 7 days?    Answer:  No  . Stool culture  . Ova and parasite examination  . Stool Cells, WBC & RBC  . DG Abd Acute W/Chest    Standing Status: Future     Number of Occurrences: 1     Standing Expiration Date: 10/21/2016    Order Specific Question:  Reason for exam:    Answer:  concern for GI bleed    Order Specific Question:  Preferred imaging location?    Answer:  External  . Comprehensive metabolic panel  . Fecal lactoferrin  . Ambulatory referral to Gastroenterology    Referral Priority:  Routine    Referral Type:  Consultation    Referral Reason:  Specialty Services Required    Number of Visits Requested:  1  . Orthostatic vital signs  . POCT CBC  . POCT urinalysis dipstick  . POCT Microscopic Urinalysis ([facility])  . POCT occult blood stool    Meds ordered this encounter  Medications  . omeprazole (PRILOSEC) 40 MG capsule    Sig: Take 1 capsule (40 mg total) by mouth daily. 30 minutes before breakfast    Dispense:  30 capsule    Refill:  1  . loperamide (IMODIUM A-D) 2 MG tablet    Sig: Take 1 tablet (2 mg total) by mouth 4 (four) times daily as needed for diarrhea or  loose stools. Take immed after loose stool only    Dispense:  30 tablet    Refill:  0  . meloxicam (MOBIC) 7.5 MG tablet    Sig: Take 1 tablet (7.5 mg total) by mouth 2 (two) times daily as needed for pain.    Dispense:  60 tablet    Refill:  0    I personally performed the services described in this documentation, which was scribed in my presence. The recorded information has been reviewed and considered, and addended by me as needed.  Norberto Sorenson, MD MPH

## 2015-10-23 ENCOUNTER — Encounter: Payer: Self-pay | Admitting: Family Medicine

## 2015-10-23 LAB — STOOL CELLS, WBC & RBC
RBC/40X FIELD: 0
WBC/40X FIELD (SOL): 0

## 2015-10-24 LAB — CLOSTRIDIUM DIFFICILE BY PCR: CDIFFPCR: NOT DETECTED

## 2015-10-24 LAB — FECAL LACTOFERRIN, QUANT: Lactoferrin: NEGATIVE

## 2015-10-25 LAB — OVA AND PARASITE EXAMINATION: OP: NONE SEEN

## 2015-10-27 LAB — STOOL CULTURE

## 2015-11-12 ENCOUNTER — Other Ambulatory Visit: Payer: Self-pay | Admitting: Family Medicine

## 2015-11-21 ENCOUNTER — Other Ambulatory Visit: Payer: Self-pay | Admitting: Family Medicine

## 2015-12-15 ENCOUNTER — Other Ambulatory Visit: Payer: Self-pay | Admitting: Family Medicine

## 2015-12-29 ENCOUNTER — Ambulatory Visit (INDEPENDENT_AMBULATORY_CARE_PROVIDER_SITE_OTHER): Payer: 59 | Admitting: Family Medicine

## 2015-12-29 VITALS — BP 130/84 | HR 72 | Temp 97.4°F | Resp 18 | Ht 72.0 in | Wt 264.0 lb

## 2015-12-29 DIAGNOSIS — I10 Essential (primary) hypertension: Secondary | ICD-10-CM

## 2015-12-29 DIAGNOSIS — M2550 Pain in unspecified joint: Secondary | ICD-10-CM | POA: Diagnosis not present

## 2015-12-29 DIAGNOSIS — L409 Psoriasis, unspecified: Secondary | ICD-10-CM | POA: Diagnosis not present

## 2015-12-29 DIAGNOSIS — F419 Anxiety disorder, unspecified: Secondary | ICD-10-CM

## 2015-12-29 DIAGNOSIS — G8929 Other chronic pain: Secondary | ICD-10-CM

## 2015-12-29 DIAGNOSIS — M25552 Pain in left hip: Secondary | ICD-10-CM

## 2015-12-29 MED ORDER — ATENOLOL 50 MG PO TABS: 50.0000 mg | ORAL_TABLET | Freq: Every day | ORAL | Status: DC

## 2015-12-29 MED ORDER — DICLOFENAC SODIUM 75 MG PO TBEC: 75.0000 mg | DELAYED_RELEASE_TABLET | Freq: Two times a day (BID) | ORAL | Status: DC

## 2015-12-29 MED ORDER — ATORVASTATIN CALCIUM 20 MG PO TABS: 20.0000 mg | ORAL_TABLET | Freq: Every day | ORAL | Status: DC

## 2015-12-29 MED ORDER — CLOBETASOL PROPIONATE 0.05 % EX OINT: 1.0000 "application " | TOPICAL_OINTMENT | Freq: Two times a day (BID) | CUTANEOUS | Status: DC

## 2015-12-29 MED ORDER — ALPRAZOLAM 0.5 MG PO TABS: 0.5000 mg | ORAL_TABLET | Freq: Every evening | ORAL | Status: DC | PRN

## 2015-12-29 MED ORDER — PREDNISONE 20 MG PO TABS: ORAL_TABLET | ORAL | Status: DC

## 2015-12-29 NOTE — Progress Notes (Signed)
By signing my name below, I, John Esparza, attest that this documentation has been prepared under the direction and in the presence of John SorensonEva Shaw, MD.  Electronically Signed: Arvilla MarketMesha Esparza, Medical Scribe. 12/29/2015. 2:54 PM.  Subjective:    [name], male    DOB: 08/11/1965, 50 y.o.   MRN: [id number]  HPI Chief Complaint  Patient presents with  . Joint Pain    Left hip, left hand, 5th digit  . Medication Refill    Alprazolam, atenolol, atorvastatin    HPI Comments: John Esparza is a 50 y.o. male who presents to the [facility] complaining of joint problems that he's been experiencing for years. He experiences pain in his shoulders, hip and 4th and 5th MCPs. He started taking antiinflammatory drugs 2 months ago. 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 His LDLs are great, and his HLD was nl but could he higher. He reports taking a lot of omega3 supplements and consuming a lot in his diet. He denies having indigestion or heart burn. He feels high stress from his job. He has had psoriasis, with the severe sections on his back, knee, and behind his ears. He took a steroid cream and it relieved him of his symptoms. He would like a refill on this cream.  Hip Pain: The pain is located in the posterior side of hip. His hip is the worst onset a couple of months ago. 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 It worsens with getting out of the bed, and tieing shoes.  Shoulder/Elbow Pain: He wakes up crying from shoulder and elbow pain. He believes his shoulder pain is due to age and thinks his elbow pain is due to how he sleeps on it.  4th and 5th MCP: He experiences stiffness and weakness in his fifth MCP with pain at a constant 2-3/10 that's worse in the morning. He feels like he only has 80% of strength when he balls his hands up.The pain is alleviated with stretching.  Patient Active Problem  List   Diagnosis Date Noted  . CAD, NATIVE VESSEL 09/21/2009  . HYPERTENSION 09/10/2009  . MYOCARDIAL INFARCTION, ACUTE, SUBENDOCARDIAL 09/10/2009  . CHEST PAIN 09/10/2009  . NEPHROLITHIASIS, HX OF 09/10/2009   Past Medical History  Diagnosis Date  . NSTEMI (non-ST elevated myocardial infarction) (HCC) 1.1.2011    with two overlapping drug eluting stents mid lad  . HTN (hypertension)   . Back problem   . Anxiety   . Psoriasis   . Hyperlipidemia    Past Surgical History  Procedure Laterality Date  . Disk repla      L5-S1  . Appendectomy    . Retinal detachment surgery  age 50    traumatic   Allergies  Allergen Reactions  . Demerol [Meperidine]     Irregular heartbeat    Prior to Admission medications   Medication Sig Start Date End Date Taking? Authorizing Provider  ALPRAZolam Prudy Feeler(XANAX) 0.5 MG tablet Take 1 tablet (0.5 mg total) by mouth at bedtime as needed for anxiety or sleep. For anxiety 03/11/15  Yes Ethelda ChickKristi M Foskey, MD  aspirin 325 MG EC tablet Take 325 mg by mouth daily.   Yes Historical Provider, MD  atenolol (TENORMIN) 50 MG tablet TAKE ONE TABLET BY MOUTH ONCE DAILY 11/14/15  Yes Chelle Jeffery, PA-C  atorvastatin (LIPITOR) 20 MG tablet Take 1 tablet (20  mg total) by mouth daily at 6 PM. 03/11/15  Yes Ethelda Chick, MD  loperamide (IMODIUM A-D) 2 MG tablet Take 1 tablet (2 mg total) by mouth 4 (four) times daily as needed for diarrhea or loose stools. Take immed after loose stool only Patient not taking: Reported on 12/29/2015 10/22/15   Sherren Mocha, MD  meloxicam (MOBIC) 7.5 MG tablet Take 1 tablet (7.5 mg total) by mouth 2 (two) times daily as needed for pain. Patient not taking: Reported on 12/29/2015 10/22/15   Sherren Mocha, MD  nitroGLYCERIN (NITROSTAT) 0.4 MG SL tablet DISSOLVE ONE TABLET UNDER THE TONGUE EVERY 5 MINUTES AS NEEDED FOR CHEST PAIN.  DO NOT EXCEED A TOTAL OF 3 DOSES IN 15 MINUTES Patient not taking: Reported on 10/22/2015 03/11/15   Ethelda Chick, MD    omeprazole (PRILOSEC) 40 MG capsule Take 1 capsule (40 mg total) by mouth daily. 30 minutes before breakfast 10/22/15   Sherren Mocha, MD  sertraline (ZOLOFT) 50 MG tablet Take 1 tablet (50 mg total) by mouth at bedtime. Patient not taking: Reported on 10/22/2015 03/11/15   Ethelda Chick, MD   Social History   Social History  . Marital Status: Single    Spouse Name: John Esparza  . Number of Children: John Esparza  . Years of Education: John Esparza   Occupational History  . Not on file.   Social History Main Topics  . Smoking status: Never Smoker   . Smokeless tobacco: Never Used  . Alcohol Use: 0.0 oz/week    0 Standard drinks or equivalent per week     Comment: drinks 12 packs of beer a week  . Drug Use: No  . Sexual Activity: Not on file   Other Topics Concern  . Not on file   Social History Narrative   Marital status: single;       Children: 2 daughters; 1 grandson      Lives: with daughter, grandson      Employment:  Emergency planning/management officer for [employer]      Tobacco; none      Alcohol:  Weekends      Exercise:  daily    Review of Systems  Gastrointestinal: Negative for nausea and vomiting.  Genitourinary: Negative for flank pain.  Musculoskeletal: Positive for joint swelling and arthralgias.  Skin: Negative for rash.  Neurological: Positive for weakness. Negative for tremors and headaches.    Objective:  BP 130/84 mmHg  Pulse 72  Temp(Src) 97.4 F (36.3 C) (Oral)  Resp 18  Ht 6' (1.829 m)  Wt 264 lb (119.75 kg)  BMI 35.80 kg/m2  SpO2 95%  Physical Exam  Constitutional: He is oriented to person, place, and time. He appears well-developed and well-nourished. No distress.  HENT:  Head: Normocephalic and atraumatic.  Eyes: Conjunctivae are normal. Pupils are equal, round, and reactive to light. No scleral icterus.  Neck: Normal range of motion. Neck supple. No thyromegaly present.  Cardiovascular: Normal rate, regular rhythm, normal heart sounds and intact distal  pulses.   Pulmonary/Chest: Effort normal and breath sounds normal. No respiratory distress.  Musculoskeletal: He exhibits no edema.       Right hip: Normal.       Left hip: Normal.  Hip: Nl ROM No pain with stretching over IT band No tenderness over the greater trochanteric Nl ROM in 4th 5th MCP with slight edema and erythema with slight TTP   Lymphadenopathy:    He has no cervical  adenopathy.  Neurological: He is alert and oriented to person, place, and time.  Skin: Skin is warm and dry. He is not diaphoretic.  Psychiatric: He has a normal mood and affect. His behavior is normal.   Assessment & Plan:   1. Psoriasis   2. Arthralgia   3. Essential hypertension   4. Anxiety     Meds ordered this encounter  Medications  . atenolol (TENORMIN) 50 MG tablet    Sig: Take 1 tablet (50 mg total) by mouth daily.    Dispense:  90 tablet    Refill:  3  . atorvastatin (LIPITOR) 20 MG tablet    Sig: Take 1 tablet (20 mg total) by mouth daily at 6 PM.    Dispense:  90 tablet    Refill:  3  . ALPRAZolam (XANAX) 0.5 MG tablet    Sig: Take 1 tablet (0.5 mg total) by mouth at bedtime as needed for anxiety or sleep. For anxiety    Dispense:  30 tablet    Refill:  0  . diclofenac (VOLTAREN) 75 MG EC tablet    Sig: Take 1 tablet (75 mg total) by mouth 2 (two) times daily.    Dispense:  60 tablet    Refill:  5  . predniSONE (DELTASONE) 20 MG tablet    Sig: 3 tabs po qd x 2d, 2 tabs po qd x 2d, 1 tab po qd x 2d    Dispense:  12 tablet    Refill:  0  . clobetasol ointment (TEMOVATE) 0.05 %    Sig: Apply 1 application topically 2 (two) times daily.    Dispense:  60 g    Refill:  2    I personally performed the services described in this documentation, which was scribed in my presence. The recorded information has been reviewed and considered, and addended by me as needed.   John Sorenson, M.D.  [facility] [address] [place], [place] [address] [phone]  phone [phone] fax  01/11/2016 12:17 PM

## 2015-12-29 NOTE — Patient Instructions (Signed)

## 2016-01-16 ENCOUNTER — Telehealth: Payer: Self-pay

## 2016-01-16 NOTE — Telephone Encounter (Signed)
Msg is for John Esparza, back left side of hip he is having more issues with and not getting relief. Pt was seen 12/29/15 and would like to see if he can get a referral to a nerve specialist.  Please advise  [phone]

## 2016-01-16 NOTE — Addendum Note (Signed)
Addended by: Norberto SorensonSHAW, EVA on: 01/16/2016 11:42 AM   Modules accepted: Orders

## 2016-01-16 NOTE — Telephone Encounter (Signed)
Ortho referral placed

## 2016-01-16 NOTE — Telephone Encounter (Signed)
Which specialist would you refer?

## 2016-06-17 ENCOUNTER — Telehealth: Payer: Self-pay

## 2016-06-17 NOTE — Telephone Encounter (Signed)
Pt would like an increase in his atenolol (TENORMIN) 50 MG tablet [[id number]][[id number]]. His BP was 160/96 this morning and after training the lower number was over 100. He would like us to increase his mgs and  [facility] [place]. I let him know we usually need to see a pt before increasing his dosage. Please advise at [phone]

## 2016-06-23 NOTE — Telephone Encounter (Signed)
Called pt and advised that we do need to see him to eval BP and probably will need to make a change in his BP meds, but labs may also need to be done. Pt agreed to OV and I transferred to clerical for appt.

## 2016-06-24 ENCOUNTER — Ambulatory Visit (INDEPENDENT_AMBULATORY_CARE_PROVIDER_SITE_OTHER): Payer: 59 | Admitting: Family Medicine

## 2016-06-24 VITALS — BP 132/82 | HR 67 | Temp 99.4°F | Resp 17 | Ht 71.5 in | Wt 265.0 lb

## 2016-06-24 DIAGNOSIS — F419 Anxiety disorder, unspecified: Secondary | ICD-10-CM | POA: Diagnosis not present

## 2016-06-24 DIAGNOSIS — E7800 Pure hypercholesterolemia, unspecified: Secondary | ICD-10-CM

## 2016-06-24 DIAGNOSIS — L409 Psoriasis, unspecified: Secondary | ICD-10-CM

## 2016-06-24 DIAGNOSIS — M25552 Pain in left hip: Secondary | ICD-10-CM

## 2016-06-24 DIAGNOSIS — G8929 Other chronic pain: Secondary | ICD-10-CM | POA: Diagnosis not present

## 2016-06-24 DIAGNOSIS — M2550 Pain in unspecified joint: Secondary | ICD-10-CM

## 2016-06-24 DIAGNOSIS — I10 Essential (primary) hypertension: Secondary | ICD-10-CM

## 2016-06-24 DIAGNOSIS — I252 Old myocardial infarction: Secondary | ICD-10-CM

## 2016-06-24 MED ORDER — CLOBETASOL PROPIONATE 0.05 % EX OINT: 1.0000 "application " | TOPICAL_OINTMENT | Freq: Two times a day (BID) | CUTANEOUS | 2 refills | Status: DC

## 2016-06-24 MED ORDER — NYSTATIN 100000 UNIT/GM EX CREA: 1.0000 "application " | TOPICAL_CREAM | Freq: Three times a day (TID) | CUTANEOUS | 2 refills | Status: DC

## 2016-06-24 MED ORDER — DICLOFENAC SODIUM 75 MG PO TBEC: 75.0000 mg | DELAYED_RELEASE_TABLET | Freq: Two times a day (BID) | ORAL | 5 refills | Status: DC

## 2016-06-24 MED ORDER — ALPRAZOLAM 0.5 MG PO TABS: 0.5000 mg | ORAL_TABLET | Freq: Every evening | ORAL | 1 refills | Status: DC | PRN

## 2016-06-24 MED ORDER — ATENOLOL 50 MG PO TABS: 50.0000 mg | ORAL_TABLET | Freq: Two times a day (BID) | ORAL | 3 refills | Status: DC

## 2016-06-24 NOTE — Progress Notes (Addendum)
Subjective:  By signing my name below, I, Stann Oresung-Kai Tsai, attest that this documentation has been prepared under the direction and in the presence of Norberto SorensonEva Taige Housman, MD. Electronically Signed: Stann Oresung-Kai Tsai, Scribe. 06/24/2016 , 9:50 AM .  Patient was seen in Room 2 .   [name], male    DOB: 02/21/1966, 50 y.o.   MRN: [id number] Chief Complaint  Patient presents with  . patient says he has high blood pressure  . Establish Care  . Medication Refill    xanax,temovate,voltaren   HPI John Polesracy W Mccune is a 50 y.o. male who [facility] to [facility] medication refill. Patient also mentions that he's been attending a class out of town. He's been eating breakfast at 5:30AM, and checking his BP after. They were doing drills outside and his BP has been increasing. He decided to self treat himself by increasing another dose of atenolol during the day, from atenolol 50mg  qd to 50mg  bid. He denies feeling draggy during the day. He notes nocturia once~twice a night recently. He denies urinary frequency, or dysuria.   He has xanax in case he needs it. He rarely requires taking it.  He denies any change with lipitor.  He also has Voltaren for his joint pain in his left 5th finger and hip. He also takes 2~3 advil at night to help with his joint pains. He informs that if he doesn't, he wakes up with aching pain throughout the night. Dr. Dareen PianoAnderson previously referred him to rheumatology, but patient did not go.  He notes psoriasis patch over his right knee, small patch over his back, and red rash under his armpits. He did mention change in his deodorant recently.   Past Medical History:  Diagnosis Date  . Anxiety   . Back problem   . HTN (hypertension)   . Hyperlipidemia   . NSTEMI (non-ST elevated myocardial infarction) (HCC) 1.1.2011   with two overlapping drug eluting stents mid lad  . Psoriasis    Prior to Admission medications   Medication Sig Start Date End Date Taking? Authorizing Provider    ALPRAZolam Prudy Feeler(XANAX) 0.5 MG tablet Take 1 tablet (0.5 mg total) by mouth at bedtime as needed for anxiety or sleep. For anxiety 12/29/15  Yes Sherren MochaEva N Corinda Ammon, MD  aspirin 325 MG EC tablet Take 325 mg by mouth daily.   Yes Historical Provider, MD  atenolol (TENORMIN) 50 MG tablet Take 1 tablet (50 mg total) by mouth daily. 12/29/15  Yes Sherren MochaEva N Reice Bienvenue, MD  atorvastatin (LIPITOR) 20 MG tablet Take 1 tablet (20 mg total) by mouth daily at 6 PM. 12/29/15  Yes Sherren MochaEva N Zaria Taha, MD  clobetasol ointment (TEMOVATE) 0.05 % Apply 1 application topically 2 (two) times daily. 12/29/15  Yes Sherren MochaEva N Tonantzin Mimnaugh, MD  diclofenac (VOLTAREN) 75 MG EC tablet Take 1 tablet (75 mg total) by mouth 2 (two) times daily. 12/29/15  Yes Sherren MochaEva N Kanani Mowbray, MD  nitroGLYCERIN (NITROSTAT) 0.4 MG SL tablet DISSOLVE ONE TABLET UNDER THE TONGUE EVERY 5 MINUTES AS NEEDED FOR CHEST PAIN.  DO NOT EXCEED A TOTAL OF 3 DOSES IN 15 MINUTES Patient not taking: Reported on 06/24/2016 03/11/15   Ethelda ChickKristi M Favero, MD   Allergies  Allergen Reactions  . Demerol [Meperidine]     Irregular heartbeat   . Morphine And Related Itching   Past Surgical History:  Procedure Laterality Date  . APPENDECTOMY    . disk repla     L5-S1  . RETINAL DETACHMENT  SURGERY  age 58   traumatic   Family History  Problem Relation Age of Onset  . Hypertension Mother     with copd  . COPD Mother   . Heart disease Mother     AMI  . Heart attack Father   . Hypertension Father     with copd  . COPD Father   . Heart attack Brother     three brothers  . Heart disease Brother   . Lupus Sister     twin  . Cancer Sister     cervical cancer  . Coronary artery disease Brother     died following procedure  . Heart disease Brother   . Heart disease Brother    Social History   Social History  . Marital status: Single    Spouse name: N/A  . Number of children: N/A  . Years of education: N/A   Social History Main Topics  . Smoking status: Never Smoker  . Smokeless tobacco: Never Used   . Alcohol use 0.0 oz/week     Comment: drinks 12 packs of beer a week  . Drug use: No  . Sexual activity: No   Other Topics Concern  . None   Social History Narrative   Marital status: single;       Children: 2 daughters; 1 grandson      Lives: with daughter, grandson      Employment:  Emergency planning/management officer for [employer]      Tobacco; none      Alcohol:  Weekends      Exercise:  daily   Depression screen [facility] 2/9 12/29/2015 10/22/2015 07/24/2015 03/11/2015  Decreased Interest 0 0 0 0  Down, Depressed, Hopeless 0 0 0 0  PHQ - 2 Score 0 0 0 0    Review of Systems  Constitutional: Negative for fatigue and unexpected weight change.  Eyes: Negative for visual disturbance.  Respiratory: Negative for cough, chest tightness and shortness of breath.   Cardiovascular: Negative for chest pain, palpitations and leg swelling.  Gastrointestinal: Negative for abdominal pain and blood in stool.  Genitourinary: Negative for dysuria, frequency and urgency.  Musculoskeletal: Positive for arthralgias.  Skin: Positive for rash.  Neurological: Negative for dizziness, light-headedness and headaches.       Objective:   Physical Exam  Constitutional: He is oriented to person, place, and time. He appears well-developed and well-nourished. No distress.  HENT:  Head: Normocephalic and atraumatic.  Eyes: EOM are normal. Pupils are equal, round, and reactive to light.  Neck: Neck supple. No thyromegaly present.  Cardiovascular: Normal rate, regular rhythm, S1 normal, S2 normal and normal heart sounds.   No murmur heard. Pulmonary/Chest: Effort normal and breath sounds normal. No respiratory distress. He has no wheezes.  Musculoskeletal: Normal range of motion.  Neurological: He is alert and oriented to person, place, and time.  Skin: Skin is warm and dry.  Psychiatric: He has a normal mood and affect. His behavior is normal.  Nursing note and vitals reviewed.   BP 132/82 (BP  Location: Right Arm, Patient Position: Sitting, Cuff Size: Normal)   Pulse 67   Temp 99.4 F (37.4 C) (Oral)   Resp 17   Ht 5' 11.5" (1.816 m)   Wt 265 lb (120.2 kg)   SpO2 96%   BMI 36.44 kg/m     Assessment & Plan:   1. Psoriasis   2. Arthralgia, unspecified joint   3. Essential hypertension  4. Hip pain, chronic, left   5. History of non-ST elevation myocardial infarction (NSTEMI)   6. Pure hypercholesterolemia   7. Anxiety    Pt not fasting so will RTC for labs fasting - lipid, cmp and check inflammatory factors due to arthralgias  Orders Placed This Encounter  Procedures  . Ambulatory referral to Rheumatology    Referral Priority:   Routine    Referral Type:   Consultation    Referral Reason:   Specialty Services Required    Requested Specialty:   Rheumatology    Number of Visits Requested:   1    Meds ordered this encounter  Medications  . ALPRAZolam (XANAX) 0.5 MG tablet    Sig: Take 1 tablet (0.5 mg total) by mouth at bedtime as needed for anxiety or sleep.    Dispense:  30 tablet    Refill:  1  . atenolol (TENORMIN) 50 MG tablet    Sig: Take 1 tablet (50 mg total) by mouth 2 (two) times daily.    Dispense:  180 tablet    Refill:  3  . clobetasol ointment (TEMOVATE) 0.05 %    Sig: Apply 1 application topically 2 (two) times daily.    Dispense:  60 g    Refill:  2  . diclofenac (VOLTAREN) 75 MG EC tablet    Sig: Take 1 tablet (75 mg total) by mouth 2 (two) times daily.    Dispense:  60 tablet    Refill:  5  . nystatin cream (MYCOSTATIN)    Sig: Apply 1 application topically 3 (three) times daily.    Dispense:  30 g    Refill:  2    I personally performed the services described in this documentation, which was scribed in my presence. The recorded information has been reviewed and considered, and addended by me as needed.   Norberto SorensonEva Kartik Fernando, M.D.  [facility]  [facility] [address] [place], [place] [address] [phone] phone [phone]  [phone] fax  07/11/16 9:17 AM

## 2016-06-24 NOTE — Patient Instructions (Signed)
Come back in for fasting labs at 8 am asap.  Managing Your Hypertension Hypertension is commonly called high blood pressure. Blood pressure is a measurement of how strongly your blood is pressing against the walls of your arteries. Arteries are blood vessels that carry blood from your heart throughout your body. Blood pressure does not stay the same. It rises when you are active, excited, or nervous. It lowers when you are sleeping or relaxed. If the numbers that measure your blood pressure stay above normal most of the time, you are at risk for health problems. Hypertension is a long-term (chronic) condition in which blood pressure is elevated. This condition often has no signs or symptoms. The cause of the condition is usually not known. What are blood pressure readings? A blood pressure reading is recorded as two numbers, such as "120 over 80" (or 120/80). The first ("top") number is called the systolic pressure. It is a measure of the pressure in your arteries as the heart beats. The second ("bottom") number is called the diastolic pressure. It is a measure of the pressure in your arteries as the heart relaxes between beats. What does my blood pressure reading mean? Blood pressure is classified into four stages. Based on your blood pressure reading, your health care provider may use the following stages to determine what type of treatment, if any, is needed. Systolic pressure and diastolic pressure are measured in a unit called mm Hg. Normal  Systolic pressure: below 120.  Diastolic pressure: below 80. Prehypertension  Systolic pressure: 120-139.  Diastolic pressure: 80-89. Hypertension stage 1  Systolic pressure: 140-159.  Diastolic pressure: 90-99. Hypertension stage 2  Systolic pressure: 160 or above.  Diastolic pressure: 100 or above. What health risks are associated with hypertension? Managing your hypertension is an important responsibility. Uncontrolled hypertension can lead  to:  A heart attack.  A stroke.  A weakened blood vessel (aneurysm).  Heart failure.  Kidney damage.  Eye damage.  Metabolic syndrome.  Memory and concentration problems. What changes can I make to manage my hypertension? Hypertension can be managed effectively by making lifestyle changes and possibly by taking medicines. Your health care provider will help you come up with a plan to bring your blood pressure within a normal range. Your plan should include the following: Monitoring  Monitor your blood pressure at home as told by your health care provider. Your personal target blood pressure may vary depending on your medical conditions, your age, and other factors.  Have your blood pressure rechecked as told by your health care provider. Lifestyle  Lose weight if necessary.  Get at least 30-45 minutes of aerobic exercise at least 4 times a week.  Do not use any products that contain nicotine or tobacco, such as cigarettes and e-cigarettes. If you need help quitting, ask your health care provider.  Learn ways to reduce stress.  Control any chronic conditions, such as high cholesterol or diabetes. Eating and drinking  Follow the DASH diet. This diet is high in fruits, vegetables, and whole grains. It is low in salt, red meat, and added sugars.  Keep your sodium intake below 2,300 mg per day.  Limit alcoholic beverages. Communication  Review all the medicines you take with your health care provider because there may be side effects or interactions.  Talk with your health care provider about your diet, exercise habits, and other lifestyle factors that may be contributing to hypertension.  See your health care provider regularly. Your health care provider can  help you create and adjust your plan for managing hypertension. Will I need medicine to control my blood pressure? Your health care provider may prescribe medicine if lifestyle changes are not enough to get your  blood pressure under control, and if one of the following is true:  You are 8618-50 years of age, and your systolic blood pressure is 140 or higher.  You are 50 years of age or older, and your systolic blood pressure is 150 or higher.  Your diastolic blood pressure is 90 or higher.  You have diabetes, and your systolic blood pressure is over 140 or your diastolic blood pressure is over 90.  You have kidney disease, and your blood pressure is above 140/90.  You have heart disease or a history of stroke, and your blood pressure is 140/90 or higher. Take medicines only as told by your health care provider. Follow the directions carefully. Blood pressure medicines must be taken as prescribed. The medicine does not work as well when you skip doses. Skipping doses also puts you at risk for problems. Contact a health care provider if:  You think you are having a reaction to medicines you have taken.  You have repeated (recurrent) headaches.  You feel dizzy.  You have swelling in your ankles.  You have trouble with your vision. Get help right away if:  You develop a severe headache or confusion.  You have unusual weakness or numbness, or you feel faint.  You have severe pain in your chest or abdomen.  You vomit repeatedly.  You have trouble breathing. This information is not intended to replace advice given to you by your health care provider. Make sure you discuss any questions you have with your health care provider. Document Released: 04/21/2012 Document Revised: 04/01/2016 Document Reviewed: 10/26/2015 Elsevier Interactive Patient Education  2017 [employer]

## 2016-08-09 ENCOUNTER — Ambulatory Visit (INDEPENDENT_AMBULATORY_CARE_PROVIDER_SITE_OTHER): Payer: 59 | Admitting: Family Medicine

## 2016-08-09 VITALS — BP 148/80 | HR 72 | Temp 98.4°F | Resp 16 | Ht 71.5 in | Wt 271.0 lb

## 2016-08-09 DIAGNOSIS — M25552 Pain in left hip: Secondary | ICD-10-CM | POA: Diagnosis not present

## 2016-08-09 DIAGNOSIS — L409 Psoriasis, unspecified: Secondary | ICD-10-CM

## 2016-08-09 DIAGNOSIS — H1031 Unspecified acute conjunctivitis, right eye: Secondary | ICD-10-CM | POA: Diagnosis not present

## 2016-08-09 DIAGNOSIS — M25512 Pain in left shoulder: Secondary | ICD-10-CM | POA: Diagnosis not present

## 2016-08-09 DIAGNOSIS — M791 Myalgia, unspecified site: Secondary | ICD-10-CM

## 2016-08-09 DIAGNOSIS — M25551 Pain in right hip: Secondary | ICD-10-CM | POA: Diagnosis not present

## 2016-08-09 DIAGNOSIS — F1990 Other psychoactive substance use, unspecified, uncomplicated: Secondary | ICD-10-CM | POA: Diagnosis not present

## 2016-08-09 DIAGNOSIS — Z8269 Family history of other diseases of the musculoskeletal system and connective tissue: Secondary | ICD-10-CM

## 2016-08-09 DIAGNOSIS — J329 Chronic sinusitis, unspecified: Secondary | ICD-10-CM | POA: Diagnosis not present

## 2016-08-09 DIAGNOSIS — M25511 Pain in right shoulder: Secondary | ICD-10-CM | POA: Diagnosis not present

## 2016-08-09 MED ORDER — AMOXICILLIN-POT CLAVULANATE 875-125 MG PO TABS: 1.0000 | ORAL_TABLET | Freq: Two times a day (BID) | ORAL | 0 refills | Status: DC

## 2016-08-09 MED ORDER — PREDNISONE 20 MG PO TABS: ORAL_TABLET | ORAL | 0 refills | Status: DC

## 2016-08-09 MED ORDER — GENTAMICIN SULFATE 0.3 % OP SOLN: 1.0000 [drp] | OPHTHALMIC | 0 refills | Status: DC

## 2016-08-09 NOTE — Patient Instructions (Signed)
Polymyalgia Rheumatica Introduction Polymyalgia rheumatica (PMR) is an inflammatory disorder that causes aching and stiffness in your muscles and joints. Sometimes, PMR leads to a more dangerous condition (temporal arteritis or giant cell arteritis), which can cause vision loss. What are the causes? The exact cause of PMR is not known. What increases the risk? This condition is more likely to develop in:  Females.  People who are 50 years of age or older.  Caucasians. What are the signs or symptoms?   Pain and stiffness are the main symptoms of PMR. Symptoms may start slowly or suddenly. The symptoms:  May be worse after inactivity and in the morning.  May affect your:  Hips, buttocks, and thighs.  Neck, arms, and shoulders. This can make it hard to raise your arms above your head.  Hands and wrists. Other symptoms include:  Fever.  Tiredness.  Weakness.  Decreased appetite. This may lead to weight loss. How is this diagnosed? This condition is diagnosed with a medical history and physical exam. You may need to see a health care provider who specializes in diseases of the joint, muscles, and bones (rheumatologist). You may also have tests, including:  Blood tests.  X-rays. How is this treated? PMR usually goes away without treatment, but it may take years for that to happen. In the meantime, your health care provider may recommend low-dose steroids to help manage your symptoms of pain and stiffness. Regular exercise and rest will also help your symptoms. Follow these instructions at home:  Take over-the-counter and prescription medicines only as told by your health care provider.  Make sure to get enough rest and sleep.  Eat a healthy and nutritious diet.  Try to exercise most days of the week. Ask your health care provider what type of exercise is best for you.  Keep all follow-up visits as told by your health are provider. This is important. Contact a health  care provider if:  Your symptoms are not controlled with medicine.  You have side effects from steroids. These may include:  Weight gain.  Swelling.  Insomnia.  Mood changes.  Bruising.  High blood sugar readings, if you have diabetes.  Higher than normal blood pressure readings, if you monitor your blood pressure. Get help right away if:  You develop symptoms of temporal arteritis, such as:  A change in vision.  Severe headache.  Scalp pain.  Jaw pain. This information is not intended to replace advice given to you by your health care provider. Make sure you discuss any questions you have with your health care provider. Document Released: 09/04/2004 Document Revised: 01/03/2016 Document Reviewed: 02/07/2015  2017 Elsevier  

## 2016-08-09 NOTE — Progress Notes (Signed)
Subjective:  By signing my name below, I, Stann Oresung-Kai Tsai, attest that this documentation has been prepared under the direction and in the presence of Norberto SorensonEva Chaise Mahabir, MD. Electronically Signed: Stann Oresung-Kai Tsai, Scribe. 08/09/2016 , 2:28 PM .  Patient was seen in Room 12 .   [name], male    DOB: 05/21/1966, 50 y.o.   MRN: [id number] Chief Complaint  Patient presents with  . URI    cough, yellow mucus/plegm, sinus pressure pain, eye matted in AM x 8 days   HPI John Esparza is a 50 y.o. male who presents to [facility] complaining of URI symptoms with cough that started about 8 days ago. Patient notes he was working 8 days ago, and when he was getting off work, he started coughing. He noted voice becoming raspy at that time. He also noticed having some "gunky" discharge around his right eye that started 3 days ago. He's been taking dayquil and nyquil for relief, which also helps with his sleep. He's been blowing out yellow mucus from his nose; denies blood in mucus. He also mentions having a headache 2~3 days ago. He denies night sweats or feeling clammy.   He also complains having persistent joint pain, worse in his upper legs. He's been taking 4 aleve tablets at night, and 3 during the day to have relief from pain.   This works much better than the diclofenac 75 bid which he does not use when he takes aleve alternatively. When he gets out from a car or up from his bed in the morning, he describes pain feeling like he just worked out for hours. He is scheduled to see a doctor regarding this issue on Sep 05, 2016.   His brother has lupus.   Past Medical History:  Diagnosis Date  . Anxiety   . Back problem   . HTN (hypertension)   . Hyperlipidemia   . NSTEMI (non-ST elevated myocardial infarction) (HCC) 1.1.2011   with two overlapping drug eluting stents mid lad  . Psoriasis    Prior to Admission medications   Medication Sig Start Date End Date Taking? Authorizing Provider    ALPRAZolam Prudy Feeler(XANAX) 0.5 MG tablet Take 1 tablet (0.5 mg total) by mouth at bedtime as needed for anxiety or sleep. 06/24/16  Yes Sherren MochaEva N Ailsa Mireles, MD  aspirin 325 MG EC tablet Take 325 mg by mouth daily.   Yes Historical Provider, MD  atenolol (TENORMIN) 50 MG tablet Take 1 tablet (50 mg total) by mouth 2 (two) times daily. 06/24/16  Yes Sherren MochaEva N Donald Jacque, MD  atorvastatin (LIPITOR) 20 MG tablet Take 1 tablet (20 mg total) by mouth daily at 6 PM. 12/29/15  Yes Sherren MochaEva N Naoma Boxell, MD  clobetasol ointment (TEMOVATE) 0.05 % Apply 1 application topically 2 (two) times daily. 06/24/16  Yes Sherren MochaEva N Corinn Stoltzfus, MD  diclofenac (VOLTAREN) 75 MG EC tablet Take 1 tablet (75 mg total) by mouth 2 (two) times daily. 06/24/16  Yes Sherren MochaEva N Makensey Rego, MD  nitroGLYCERIN (NITROSTAT) 0.4 MG SL tablet DISSOLVE ONE TABLET UNDER THE TONGUE EVERY 5 MINUTES AS NEEDED FOR CHEST PAIN.  DO NOT EXCEED A TOTAL OF 3 DOSES IN 15 MINUTES 03/11/15  Yes Ethelda ChickKristi M Loomis, MD  nystatin cream (MYCOSTATIN) Apply 1 application topically 3 (three) times daily. 06/24/16  Yes Sherren MochaEva N Morrison Masser, MD   Allergies  Allergen Reactions  . Demerol [Meperidine]     Irregular heartbeat   . Morphine And Related Itching   Review of  Systems  Constitutional: Positive for fatigue. Negative for chills, diaphoresis and fever.  HENT: Positive for congestion, sinus pain, sinus pressure and voice change.   Eyes: Positive for discharge. Negative for itching.  Respiratory: Positive for cough. Negative for shortness of breath and wheezing.   Musculoskeletal: Positive for arthralgias and myalgias.  Neurological: Positive for headaches.       Objective:   Physical Exam  Constitutional: He is oriented to person, place, and time. He appears well-developed and well-nourished. No distress.  HENT:  Head: Normocephalic and atraumatic.  Right Ear: Tympanic membrane normal.  Left Ear: Tympanic membrane normal.  Nose: Nose normal.  Mouth/Throat: Posterior oropharyngeal erythema present.  Eyes: EOM are  normal. Pupils are equal, round, and reactive to light. Right conjunctiva is injected.  Right lower lid slightly swollen  Neck: Neck supple.  Cardiovascular: Normal rate, regular rhythm, S1 normal, S2 normal and normal heart sounds.   No murmur heard. Pulmonary/Chest: Effort normal and breath sounds normal. No respiratory distress. He has no decreased breath sounds.  Musculoskeletal: Normal range of motion.  Neurological: He is alert and oriented to person, place, and time.  Skin: Skin is warm and dry.  Psychiatric: He has a normal mood and affect. His behavior is normal.  Nursing note and vitals reviewed.   BP (!) 148/80 (BP Location: Right Arm, Patient Position: Sitting, Cuff Size: Large)   Pulse 72   Temp 98.4 F (36.9 C) (Oral)   Resp 16   Ht 5' 11.5" (1.816 m)   Wt 271 lb (122.9 kg)   SpO2 97%   BMI 37.27 kg/m     Assessment & Plan:   1. Sinusitis, unspecified chronicity, unspecified location   2. Myalgia   3. Pain of both hip joints   4. Pain of both shoulder joints   5. Psoriasis   6. Family history of systemic lupus erythematosus   7. Excessive use of nonsteroidal anti-inflammatory drug (NSAID)    Patient has an appointment with Dr. Corliss Skainseveshwar in sev wks sure for initial evaluation of his proximal upper and lower extremities symmetrical arthralgias and myalgias. Ddx inc: OA, psoriatric arthritis, SLE, PMR.  He has not come in for lab work as we have previously discussed so inflammatory factors, autoimmune labs, and recent renal function unknown - will defer today since may be elev due to acute illness.  While he is on this course of prednisone for sinusitis plate close attention to its affect on his arthralgias.  Stop nsaids will on prednisone, then reviewed importance of not overusing. Will be off pred in > 2wks from rheum eval which I think should be adequate.  Meds ordered this encounter  Medications  . amoxicillin-clavulanate (AUGMENTIN) 875-125 MG tablet    Sig:  Take 1 tablet by mouth 2 (two) times daily.    Dispense:  20 tablet    Refill:  0  . predniSONE (DELTASONE) 20 MG tablet    Sig: Take 3 tabs qd x 3d, then 2 tabs qd x 3d then 1 tab qd x 3d.    Dispense:  18 tablet    Refill:  0  . gentamicin (GARAMYCIN) 0.3 % ophthalmic solution    Sig: Place 1 drop into the right eye every 4 (four) hours.    Dispense:  5 mL    Refill:  0    I personally performed the services described in this documentation, which was scribed in my presence. The recorded information has been reviewed and considered, and addended  by me as needed.   Norberto Sorenson, M.D.  [facility] [address] [place], [place] [address] [phone] phone [phone] fax  08/11/16 1:54 PM

## 2016-08-20 DIAGNOSIS — L4050 Arthropathic psoriasis, unspecified: Secondary | ICD-10-CM | POA: Insufficient documentation

## 2016-09-04 DIAGNOSIS — Z840 Family history of diseases of the skin and subcutaneous tissue: Secondary | ICD-10-CM | POA: Insufficient documentation
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
## 2016-09-05 ENCOUNTER — Ambulatory Visit (INDEPENDENT_AMBULATORY_CARE_PROVIDER_SITE_OTHER): Payer: 59

## 2016-09-05 ENCOUNTER — Ambulatory Visit (INDEPENDENT_AMBULATORY_CARE_PROVIDER_SITE_OTHER): Payer: 59 | Admitting: Rheumatology

## 2016-09-05 ENCOUNTER — Encounter: Payer: Self-pay | Admitting: Rheumatology

## 2016-09-05 VITALS — BP 136/78 | HR 88 | Resp 18 | Ht 72.0 in | Wt 268.0 lb

## 2016-09-05 DIAGNOSIS — I10 Essential (primary) hypertension: Secondary | ICD-10-CM

## 2016-09-05 DIAGNOSIS — L409 Psoriasis, unspecified: Secondary | ICD-10-CM

## 2016-09-05 DIAGNOSIS — G8929 Other chronic pain: Secondary | ICD-10-CM | POA: Diagnosis not present

## 2016-09-05 DIAGNOSIS — M25512 Pain in left shoulder: Secondary | ICD-10-CM

## 2016-09-05 DIAGNOSIS — M791 Myalgia, unspecified site: Secondary | ICD-10-CM

## 2016-09-05 DIAGNOSIS — M722 Plantar fascial fibromatosis: Secondary | ICD-10-CM

## 2016-09-05 DIAGNOSIS — M25511 Pain in right shoulder: Secondary | ICD-10-CM

## 2016-09-05 DIAGNOSIS — I25119 Atherosclerotic heart disease of native coronary artery with unspecified angina pectoris: Secondary | ICD-10-CM

## 2016-09-05 DIAGNOSIS — M7062 Trochanteric bursitis, left hip: Secondary | ICD-10-CM | POA: Diagnosis not present

## 2016-09-05 DIAGNOSIS — M79642 Pain in left hand: Secondary | ICD-10-CM | POA: Diagnosis not present

## 2016-09-05 DIAGNOSIS — Z79899 Other long term (current) drug therapy: Secondary | ICD-10-CM | POA: Diagnosis not present

## 2016-09-05 DIAGNOSIS — Z840 Family history of diseases of the skin and subcutaneous tissue: Secondary | ICD-10-CM

## 2016-09-05 DIAGNOSIS — Z9889 Other specified postprocedural states: Secondary | ICD-10-CM

## 2016-09-05 DIAGNOSIS — Z87442 Personal history of urinary calculi: Secondary | ICD-10-CM

## 2016-09-05 LAB — CBC WITH DIFFERENTIAL/PLATELET
BASOS ABS: 73 {cells}/uL (ref 0–200)
Basophils Relative: 1 %
EOS PCT: 4 %
Eosinophils Absolute: 292 cells/uL (ref 15–500)
HCT: 45 % (ref 38.5–50.0)
HEMOGLOBIN: 15.1 g/dL (ref 13.2–17.1)
LYMPHS ABS: 2847 {cells}/uL (ref 850–3900)
Lymphocytes Relative: 39 %
MCH: 32.5 pg (ref 27.0–33.0)
MCHC: 33.6 g/dL (ref 32.0–36.0)
MCV: 97 fL (ref 80.0–100.0)
MONOS PCT: 11 %
MPV: 9.2 fL (ref 7.5–12.5)
Monocytes Absolute: 803 cells/uL (ref 200–950)
NEUTROS ABS: 3285 {cells}/uL (ref 1500–7800)
Neutrophils Relative %: 45 %
PLATELETS: 193 10*3/uL (ref 140–400)
RBC: 4.64 MIL/uL (ref 4.20–5.80)
RDW: 13.2 % (ref 11.0–15.0)
WBC: 7.3 10*3/uL (ref 3.8–10.8)

## 2016-09-05 LAB — TSH: TSH: 1.47 m[IU]/L (ref 0.40–4.50)

## 2016-09-05 MED ORDER — TRIAMCINOLONE ACETONIDE 40 MG/ML IJ SUSP
40.0000 mg | INTRAMUSCULAR | Status: AC | PRN
  Administered 2016-09-05: 40 mg via INTRA_ARTICULAR

## 2016-09-05 MED ORDER — LIDOCAINE HCL 1 % IJ SOLN
1.5000 mL | INTRAMUSCULAR | Status: AC | PRN
  Administered 2016-09-05: 1.5 mL

## 2016-09-05 MED ORDER — TRIAMCINOLONE ACETONIDE 40 MG/ML IJ SUSP
60.0000 mg | INTRAMUSCULAR | Status: AC | PRN
  Administered 2016-09-05: 60 mg via INTRA_ARTICULAR

## 2016-09-05 NOTE — Progress Notes (Signed)
Pharmacy Note  Subjective: Patient presents today to the [facility] to see Dr. Corliss Skainseveshwar.  Patient seen by the pharmacist for counseling on methotrexate.    Objective: CBC    Component Value Date/Time   WBC 7.2 10/22/2015 1243   WBC 9.1 03/11/2015 1559   RBC 4.88 10/22/2015 1243   RBC 4.89 03/11/2015 1559   HGB 16.1 10/22/2015 1243   HGB 16.0 03/11/2015 1559   HCT 44.8 10/22/2015 1243   HCT 45.9 03/11/2015 1559   PLT 224 03/11/2015 1559   MCV 91.8 10/22/2015 1243   MCH 32.9 (A) 10/22/2015 1243   MCH 32.7 03/11/2015 1559   MCHC 35.8 (A) 10/22/2015 1243   MCHC 34.9 03/11/2015 1559   RDW 12.8 03/11/2015 1559   LYMPHSABS 4.2 (H) 03/11/2015 1559   MONOABS 0.9 03/11/2015 1559   EOSABS 0.3 03/11/2015 1559   BASOSABS 0.1 03/11/2015 1559    CMP     Component Value Date/Time   NA 139 10/22/2015 1232   K 4.2 10/22/2015 1232   CL 103 10/22/2015 1232   CO2 27 10/22/2015 1232   GLUCOSE 95 10/22/2015 1232   BUN 9 10/22/2015 1232   CREATININE 0.86 10/22/2015 1232   CALCIUM 9.0 10/22/2015 1232   PROT 7.5 10/22/2015 1232   ALBUMIN 4.2 10/22/2015 1232   AST 29 10/22/2015 1232   ALT 44 10/22/2015 1232   ALKPHOS 49 10/22/2015 1232   BILITOT 0.4 10/22/2015 1232   GFRNONAA 82 (L) 04/11/2012 1520   GFRAA >90 04/11/2012 1520    TB Gold: ordered today Hepatitis panel: ordered today HIV: ordered today  Chest-xray:  ordered today  Assessment/Plan:  Patient was counseled on the purpose, proper use, and adverse effects of methotrexate including nausea, infection, and signs and symptoms of pneumonitis.  Reviewed instructions with patient to take methotrexate weekly along with folic acid daily.  Discussed the importance of frequent monitoring of kidney and liver function and blood counts.  Counseled patient to avoid alcohol while on methotrexate.  Also discussed importance of strict contraception.  Patient confirms he had a vasectomy.  Provided patient with educational  materials on methotrexate and answered all questions.  Advised patient to get annual influenza vaccine and to get a pneumococcal vaccine if patient has not already had one.  Patient voiced understanding.  Patient wanted to read more about methotrexate prior to initiation.  I gave patient education materials on methotrexate.  Will readdress at patient's new patient follow up.     Lilla Shookachel Iyani Dresner, Pharm.D., BCPS Clinical Pharmacist Pager: [phone] Phone: [phone] 09/05/2016 9:42 AM

## 2016-09-05 NOTE — Patient Instructions (Signed)
Please see your PCP for a pneumonia vaccine  Methotrexate tablets What is this medicine? METHOTREXATE (METH oh TREX ate) is a chemotherapy drug used to treat cancer including breast cancer, leukemia, and lymphoma. This medicine can also be used to treat psoriasis and certain kinds of arthritis. This medicine may be used for other purposes; ask your health care provider or pharmacist if you have questions. COMMON BRAND NAME(S): Rheumatrex, Trexall What should I tell my health care provider before I take this medicine? They need to know if you have any of these conditions: -fluid in the stomach area or lungs -if you often drink alcohol -infection or immune system problems -kidney disease or on hemodialysis -liver disease -low blood counts, like low white cell, platelet, or red cell counts -lung disease -radiation therapy -stomach ulcers -ulcerative colitis -an unusual or allergic reaction to methotrexate, other medicines, foods, dyes, or preservatives -pregnant or trying to get pregnant -breast-feeding How should I use this medicine? Take this medicine by mouth with a glass of water. Follow the directions on the prescription label. Take your medicine at regular intervals. Do not take it more often than directed. Do not stop taking except on your doctor's advice. Make sure you know why you are taking this medicine and how often you should take it. If this medicine is used for a condition that is not cancer, like arthritis or psoriasis, it should be taken weekly, NOT daily. Taking this medicine more often than directed can cause serious side effects, even death. Talk to your healthcare provider about safe handling and disposal of this medicine. You may need to take special precautions. Talk to your pediatrician regarding the use of this medicine in children. While this drug may be prescribed for selected conditions, precautions do apply. Overdosage: If you think you have taken too much of this  medicine contact a poison control center or emergency room at once. NOTE: This medicine is only for you. Do not share this medicine with others. What if I miss a dose? If you miss a dose, talk with your doctor or health care professional. Do not take double or extra doses. What may interact with this medicine? This medicine may interact with the following medication: -acitretin -aspirin and aspirin-like medicines including salicylates -azathioprine -certain antibiotics like penicillins, tetracycline, and chloramphenicol -cyclosporine -gold -hydroxychloroquine -live virus vaccines -NSAIDs, medicines for pain and inflammation, like ibuprofen or naproxen -other cytotoxic agents -penicillamine -phenylbutazone -phenytoin -probenecid -retinoids such as isotretinoin and tretinoin -steroid medicines like prednisone or cortisone -sulfonamides like sulfasalazine and trimethoprim/sulfamethoxazole -theophylline This list may not describe all possible interactions. Give your health care provider a list of all the medicines, herbs, non-prescription drugs, or dietary supplements you use. Also tell them if you smoke, drink alcohol, or use illegal drugs. Some items may interact with your medicine. What should I watch for while using this medicine? Avoid alcoholic drinks. This medicine can make you more sensitive to the sun. Keep out of the sun. If you cannot avoid being in the sun, wear protective clothing and use sunscreen. Do not use sun lamps or tanning beds/booths. You may need blood work done while you are taking this medicine. Call your doctor or health care professional for advice if you get a fever, chills or sore throat, or other symptoms of a cold or flu. Do not treat yourself. This drug decreases your body's ability to fight infections. Try to avoid being around people who are sick. This medicine may increase your risk to bruise  or bleed. Call your doctor or health care professional if you  notice any unusual bleeding. Check with your doctor or health care professional if you get an attack of severe diarrhea, nausea and vomiting, or if you sweat a lot. The loss of too much body fluid can make it dangerous for you to take this medicine. Talk to your doctor about your risk of cancer. You may be more at risk for certain types of cancers if you take this medicine. Both men and women must use effective birth control with this medicine. Do not become pregnant while taking this medicine or until at least 1 normal menstrual cycle has occurred after stopping it. Women should inform their doctor if they wish to become pregnant or think they might be pregnant. Men should not father a child while taking this medicine and for 3 months after stopping it. There is a potential for serious side effects to an unborn child. Talk to your health care professional or pharmacist for more information. Do not breast-feed an infant while taking this medicine. What side effects may I notice from receiving this medicine? Side effects that you should report to your doctor or health care professional as soon as possible: -allergic reactions like skin rash, itching or hives, swelling of the face, lips, or tongue -breathing problems or shortness of breath -diarrhea -dry, nonproductive cough -low blood counts - this medicine may decrease the number of white blood cells, red blood cells and platelets. You may be at increased risk for infections and bleeding. -mouth sores -redness, blistering, peeling or loosening of the skin, including inside the mouth -signs of infection - fever or chills, cough, sore throat, pain or trouble passing urine -signs and symptoms of bleeding such as bloody or black, tarry stools; red or dark-brown urine; spitting up blood or brown material that looks like coffee grounds; red spots on the skin; unusual bruising or bleeding from the eye, gums, or nose -signs and symptoms of kidney injury like  trouble passing urine or change in the amount of urine -signs and symptoms of liver injury like dark yellow or brown urine; general ill feeling or flu-like symptoms; light-colored stools; loss of appetite; nausea; right upper belly pain; unusually weak or tired; yellowing of the eyes or skin Side effects that usually do not require medical attention (report to your doctor or health care professional if they continue or are bothersome): -dizziness -hair loss -tiredness -upset stomach -vomiting This list may not describe all possible side effects. Call your doctor for medical advice about side effects. You may report side effects to FDA at 1-800-FDA-1088. Where should I keep my medicine? Keep out of the reach of children. Store at room temperature between 20 and 25 degrees C (68 and 77 degrees F). Protect from light. Throw away any unused medicine after the expiration date. NOTE: This sheet is a summary. It may not cover all possible information. If you have questions about this medicine, talk to your doctor, pharmacist, or health care provider.  2017 Elsevier/Gold Standard (2015-04-02 05:39:22)

## 2016-09-06 LAB — QUANTIFERON TB GOLD ASSAY (BLOOD)
Interferon Gamma Release Assay: NEGATIVE
QUANTIFERON NIL VALUE: 0.02 [IU]/mL
Quantiferon Tb Ag Minus Nil Value: 0 IU/mL

## 2016-09-06 LAB — COMPLETE METABOLIC PANEL WITH GFR
ALBUMIN: 4.1 g/dL (ref 3.6–5.1)
ALK PHOS: 61 U/L (ref 40–115)
ALT: 55 U/L — ABNORMAL HIGH (ref 9–46)
AST: 25 U/L (ref 10–35)
BUN: 14 mg/dL (ref 7–25)
CO2: 23 mmol/L (ref 20–31)
Calcium: 8.9 mg/dL (ref 8.6–10.3)
Chloride: 102 mmol/L (ref 98–110)
Creat: 1.01 mg/dL (ref 0.70–1.33)
GFR, EST NON AFRICAN AMERICAN: 86 mL/min (ref >=60)
GFR, Est African American: >89 mL/min (ref >=60)
GLUCOSE: 155 mg/dL — AB (ref 65–99)
POTASSIUM: 4.2 mmol/L (ref 3.5–5.3)
Sodium: 137 mmol/L (ref 135–146)
TOTAL PROTEIN: 7.2 g/dL (ref 6.1–8.1)
Total Bilirubin: 0.4 mg/dL (ref 0.2–1.2)

## 2016-09-06 LAB — SEDIMENTATION RATE: Sed Rate: 6 mm/hr (ref 0–15)

## 2016-09-06 LAB — HEPATITIS B SURFACE ANTIGEN: Hepatitis B Surface Ag: NEGATIVE

## 2016-09-06 LAB — HEPATITIS B CORE ANTIBODY, IGM: HEP B C IGM: NONREACTIVE

## 2016-09-06 LAB — CK: CK TOTAL: 115 U/L (ref 7–232)

## 2016-09-06 LAB — HIV ANTIBODY (ROUTINE TESTING W REFLEX): HIV 1&2 Ab, 4th Generation: NONREACTIVE

## 2016-09-06 LAB — HEPATITIS C ANTIBODY: HCV Ab: NEGATIVE

## 2016-09-08 LAB — CYCLIC CITRUL PEPTIDE ANTIBODY, IGG

## 2016-09-08 LAB — IGG, IGA, IGM
IGA: 519 mg/dL — AB (ref 81–463)
IGG (IMMUNOGLOBIN G), SERUM: 1205 mg/dL (ref 694–1618)
IgM, Serum: 123 mg/dL (ref 48–271)

## 2016-09-08 LAB — RHEUMATOID FACTOR: Rhuematoid fact SerPl-aCnc: <14 IU/mL (ref <14)

## 2016-09-09 LAB — PROTEIN ELECTROPHORESIS, SERUM, WITH REFLEX
ALPHA-2-GLOBULIN: 0.7 g/dL (ref 0.5–0.9)
Albumin ELP: 4.1 g/dL (ref 3.8–4.8)
Alpha-1-Globulin: 0.2 g/dL (ref 0.2–0.3)
BETA GLOBULIN: 0.5 g/dL (ref 0.4–0.6)
Beta 2: 0.5 g/dL (ref 0.2–0.5)
GAMMA GLOBULIN: 1.2 g/dL (ref 0.8–1.7)
Total Protein, Serum Electrophoresis: 7.2 g/dL (ref 6.1–8.1)

## 2016-09-09 NOTE — Progress Notes (Signed)
Mild elevation of LFTs. He should avoid anti-inflammatories

## 2016-09-11 ENCOUNTER — Ambulatory Visit: Payer: 59 | Admitting: Rheumatology

## 2016-09-30 NOTE — Progress Notes (Deleted)
Office Visit Note  Patient: John Esparza             Date of Birth: [birth year]           MRN: [id number]             PCP: Delman Cheadle, MD Referring: Shawnee Knapp, MD Visit Date: 10/09/2016 Occupation: _0 @    Subjective:  No chief complaint on file.   History of Present Illness: John Esparza is a 51 y.o. male ***   Activities of Daily Living:  Patient reports morning stiffness for *** {minute/hour:19697}.   Patient {ACTIONS;DENIES/REPORTS:21021675::"Denies"} nocturnal pain.  Difficulty dressing/grooming: {ACTIONS;DENIES/REPORTS:21021675::"Denies"} Difficulty climbing stairs: {ACTIONS;DENIES/REPORTS:21021675::"Denies"} Difficulty getting out of chair: {ACTIONS;DENIES/REPORTS:21021675::"Denies"} Difficulty using hands for taps, buttons, cutlery, and/or writing: {ACTIONS;DENIES/REPORTS:21021675::"Denies"}   No Rheumatology ROS completed.   PMFS History:  Patient Active Problem List   Diagnosis Date Noted  . Family history of lupus erythematosus 09/04/2016  . Psoriasis 12/29/2015  . CAD, NATIVE VESSEL 09/21/2009  . Essential hypertension 09/10/2009  . MYOCARDIAL INFARCTION, ACUTE, SUBENDOCARDIAL 09/10/2009  . CHEST PAIN 09/10/2009  . NEPHROLITHIASIS, HX OF 09/10/2009    Past Medical History:  Diagnosis Date  . Anxiety   . Back problem   . HTN (hypertension)   . Hyperlipidemia   . NSTEMI (non-ST elevated myocardial infarction) ([place]) 1.1.2011   with two overlapping drug eluting stents mid lad  . Psoriasis     Family History  Problem Relation Age of Onset  . Hypertension Mother     with copd  . COPD Mother   . Heart disease Mother     AMI  . Heart attack Father   . Hypertension Father     with copd  . COPD Father   . Heart attack Brother     three brothers  . Heart disease Brother   . Lupus Sister     twin  . Cancer Sister     cervical cancer  . Coronary artery disease Brother     died following procedure  . Heart disease Brother   . Heart disease  Brother    Past Surgical History:  Procedure Laterality Date  . APPENDECTOMY    . disk repla     L5-S1  . RETINAL DETACHMENT SURGERY  age 51   traumatic   Social History   Social History Narrative   Marital status: single;       Children: 2 daughters; 1 grandson      Lives: with daughter, grandson      Employment:  Engineer, structural for [employer]; none      Alcohol:  Weekends      Exercise:  daily     Objective: Vital Signs: There were no vitals taken for this visit.   Physical Exam   Musculoskeletal Exam: ***  CDAI Exam: No CDAI exam completed.    Investigation: No additional findings. Office Visit on 09/05/2016  Component Date Value Ref Range Status  . WBC 09/05/2016 7.3  3.8 - 10.8 K/uL Final  . RBC 09/05/2016 4.64  4.20 - 5.80 MIL/uL Final  . Hemoglobin 09/05/2016 15.1  13.2 - 17.1 g/dL Final  . HCT 09/05/2016 45.0  38.5 - 50.0 % Final  . MCV 09/05/2016 97.0  80.0 - 100.0 fL Final  . MCH 09/05/2016 32.5  27.0 - 33.0 pg Final  . MCHC 09/05/2016 33.6  32.0 - 36.0 g/dL Final  . RDW 09/05/2016 13.2  11.0 -  15.0 % Final  . Platelets 09/05/2016 193  140 - 400 K/uL Final  . MPV 09/05/2016 9.2  7.5 - 12.5 fL Final  . Neutro Abs 09/05/2016 3285  1,500 - 7,800 cells/uL Final  . Lymphs Abs 09/05/2016 2847  850 - 3,900 cells/uL Final  . Monocytes Absolute 09/05/2016 803  200 - 950 cells/uL Final  . Eosinophils Absolute 09/05/2016 292  15 - 500 cells/uL Final  . Basophils Absolute 09/05/2016 73  0 - 200 cells/uL Final  . Neutrophils Relative % 09/05/2016 45  % Final  . Lymphocytes Relative 09/05/2016 39  % Final  . Monocytes Relative 09/05/2016 11  % Final  . Eosinophils Relative 09/05/2016 4  % Final  . Basophils Relative 09/05/2016 1  % Final  . Smear Review 09/05/2016 Criteria for review not met   Final  . Sodium 09/05/2016 137  135 - 146 mmol/L Final  . Potassium 09/05/2016 4.2  3.5 - 5.3 mmol/L Final  . Chloride 09/05/2016 102   98 - 110 mmol/L Final  . CO2 09/05/2016 23  20 - 31 mmol/L Final  . Glucose, Bld 09/05/2016 155* 65 - 99 mg/dL Final  . BUN 09/05/2016 14  7 - 25 mg/dL Final  . Creat 09/05/2016 1.01  0.70 - 1.33 mg/dL Final   Comment:   For patients > or = 51 years of age: The upper reference limit for Creatinine is approximately 13% higher for people identified as African-American.     . Total Bilirubin 09/05/2016 0.4  0.2 - 1.2 mg/dL Final  . Alkaline Phosphatase 09/05/2016 61  40 - 115 U/L Final  . AST 09/05/2016 25  10 - 35 U/L Final  . ALT 09/05/2016 55* 9 - 46 U/L Final  . Total Protein 09/05/2016 7.2  6.1 - 8.1 g/dL Final  . Albumin 09/05/2016 4.1  3.6 - 5.1 g/dL Final  . Calcium 09/05/2016 8.9  8.6 - 10.3 mg/dL Final  . GFR, Est African American 09/05/2016 >89  >=60 mL/min Final  . GFR, Est Non African American 09/05/2016 86  >=60 mL/min Final  . Sed Rate 09/05/2016 6  0 - 15 mm/hr Final  . Rhuematoid fact SerPl-aCnc 09/05/2016 <14  <14 IU/mL Final  . Cyclic Citrullin Peptide Ab 09/05/2016 <16  Units Final   Comment:   Reference Range Negative               < 20 Weak Positive            20 - 39 Moderate Positive        40 - 59 Strong Positive        > 59   . TSH 09/05/2016 1.47  0.40 - 4.50 mIU/L Final  . Total CK 09/05/2016 115  7 - 232 U/L Final  . Interferon Gamma Release Assay 09/05/2016 NEGATIVE  NEGATIVE Final  . Quantiferon Nil Value 09/05/2016 0.02  IU/mL Final  . Mitogen-Nil 09/05/2016 >10.00  IU/mL Final  . Quantiferon Tb Ag Minus Nil Value 09/05/2016 0.00  IU/mL Final   Comment:   The Nil tube value is used to determine if the patient has a preexisting immune response which could cause a false-positive reading on the test. In order for a test to be valid, the Nil tube must have a value of less than or equal to 8.0 IU/mL.   The mitogen control tube is used to assure the patient has a healthy immune status and also serves as a control for correct blood  handling and  incubation. It is used to detect false-negative readings. The mitogen tube must have a gamma interferon value of greater than or equal to 0.5 IU/mL higher than the value of the Nil tube.   The TB antigen tube is coated with the M. tuberculosis specific antigens. For a test to be considered positive, the TB antigen tube value minus the Nil tube value must be greater than or equal to 0.35 IU/mL.   For additional information, please refer to http://education.quest[facility].com/faq/QFT (This link is being provided for informational/educational purposes only.)   . Hepatitis B Surface Ag 09/05/2016 NEGATIVE  NEGATIVE Final  . Hep B C IgM 09/05/2016 NON REACTIVE  NON REACTIVE Final   Comment: High levels of Hepatitis B Core IgM antibody are detectable during the acute stage of Hepatitis B. This antibody is used to differentiate current from past HBV infection.     Marland Kitchen HCV Ab 09/05/2016 NEGATIVE  NEGATIVE Final  . HIV 1&2 Ab, 4th Generation 09/05/2016 NONREACTIVE  NONREACTIVE Final   Comment:   HIV-1 antigen and HIV-1/HIV-2 antibodies were not detected.  There is no laboratory evidence of HIV infection.   HIV-1/2 Antibody Diff        Not indicated. HIV-1 RNA, Qual TMA          Not indicated.     PLEASE NOTE: This information has been disclosed to you from records whose confidentiality may be protected by state law. If your state requires such protection, then the state law prohibits you from making any further disclosure of the information without the specific written consent of the person to whom it pertains, or as otherwise permitted by law. A general authorization for the release of medical or other information is NOT sufficient for this purpose.   The performance of this assay has not been clinically validated in patients less than 50 years old.   For additional information please refer to http://education.[facility].com/faq/FAQ106.  (This link is being provided for  informational/educational purposes only.)     . IgG (Immunoglobin G), Serum 09/05/2016 1205  694 - 1,618 mg/dL Final  . IgA 09/05/2016 519* 81 - 463 mg/dL Final  . IgM, Serum 09/05/2016 123  48 - 271 mg/dL Final  . Total Protein, Serum Electrophores* 09/05/2016 7.2  6.1 - 8.1 g/dL Final  . Albumin ELP 09/05/2016 4.1  3.8 - 4.8 g/dL Final  . Alpha-1-Globulin 09/05/2016 0.2  0.2 - 0.3 g/dL Final  . Alpha-2-Globulin 09/05/2016 0.7  0.5 - 0.9 g/dL Final  . Beta Globulin 09/05/2016 0.5  0.4 - 0.6 g/dL Final  . Beta 2 09/05/2016 0.5  0.2 - 0.5 g/dL Final  . Gamma Globulin 09/05/2016 1.2  0.8 - 1.7 g/dL Final  . Abnormal Protein Band1 09/05/2016 NOT DET  g/dL Final  . SPE Interp. 09/05/2016 SEE NOTE   Final   Comment: Normal Electrophoretic Pattern Reviewed by Odis Hollingshead, MD, PhD, FCAP (Electronic Signature on File)   . Abnormal Protein Band2 09/05/2016 NOT DET  g/dL Final  . Abnormal Protein Band3 09/05/2016 NOT DET  g/dL Final     Imaging: Xr Hand 2 View Left  Result Date: 09/05/2016 PIP/DIP minimal narrowing no MCP joint narrowing no intercarpal joint space narrowing no erosive changes mild soft tissue swelling was noted in the left fifth digit.  Xr Shoulder Left  Result Date: 09/05/2016 No glenohumeral joint space narrowing mild before meals joint narrowing with spurring noted no chondrocalcinosis noted. Impression: These findings were within normal limits  Xr  Shoulder Right  Result Date: 09/05/2016 No glenohumeral joint space narrowing mild before meals joint narrowing with spurring noted no chondrocalcinosis noted. Impression: These findings were within normal limits   Speciality Comments: No specialty comments available.    Procedures:  No procedures performed Allergies: Demerol [meperidine] and Morphine and related   Assessment / Plan:     Visit Diagnoses: Psoriasis  Family history of lupus erythematosus  NEPHROLITHIASIS, HX OF  History of  hypertension  Chronic pain of multiple joints    Orders: No orders of the defined types were placed in this encounter.  No orders of the defined types were placed in this encounter.   Face-to-face time spent with patient was *** minutes. 50% of time was spent in counseling and coordination of care.  Follow-Up Instructions: No Follow-up on file.   Hurbert Duran, RT  Note - This record has been created using [employer].  Chart creation errors have been sought, but may not always  have been located. Such creation errors do not reflect on  the standard of medical care.

## 2016-10-09 ENCOUNTER — Ambulatory Visit: Payer: 59 | Admitting: Rheumatology

## 2016-11-15 ENCOUNTER — Other Ambulatory Visit: Payer: Self-pay

## 2016-11-15 MED ORDER — CLOBETASOL PROPIONATE 0.05 % EX OINT: 1.0000 "application " | TOPICAL_OINTMENT | Freq: Two times a day (BID) | CUTANEOUS | 3 refills | Status: DC

## 2016-11-15 NOTE — Telephone Encounter (Signed)
06/2016 last ov and med refill

## 2017-01-13 ENCOUNTER — Other Ambulatory Visit: Payer: Self-pay | Admitting: Family Medicine

## 2017-01-16 NOTE — Telephone Encounter (Signed)
Patient has not had his lipid panel rechecked since starting the atorvastatin 2 years ago so we have no idea if he is on the right dose. He also needs his liver rechecked since it was newly slightly irritated when last checked by Dr. Corliss Skainseveshwar 5 months prior.  I have future orders in for this that were placed on 07/11/16. I recommend that he come by to have his fasting labs drawn in the lab only visit and then follow-up with me several days later in an office visit to review results. Will provide 3 months of refills on the atorvastatin to give him time to get the fasting labs done.   We do not refill antibiotics and if he is feeling like he might need one, I recommend that he come in for a visit ASAP.

## 2017-04-20 ENCOUNTER — Encounter: Payer: Self-pay | Admitting: Family Medicine

## 2017-04-20 ENCOUNTER — Ambulatory Visit (INDEPENDENT_AMBULATORY_CARE_PROVIDER_SITE_OTHER): Payer: 59 | Admitting: Family Medicine

## 2017-04-20 VITALS — BP 160/94 | HR 61 | Temp 97.9°F | Resp 18 | Ht 71.0 in | Wt 261.0 lb

## 2017-04-20 DIAGNOSIS — L4050 Arthropathic psoriasis, unspecified: Secondary | ICD-10-CM

## 2017-04-20 HISTORY — DX: Arthropathic psoriasis, unspecified: L40.50

## 2017-04-20 MED ORDER — PREDNISONE 20 MG PO TABS: ORAL_TABLET | ORAL | 0 refills | Status: DC

## 2017-04-20 MED ORDER — PREDNISONE 5 MG PO TABS: 5.0000 mg | ORAL_TABLET | Freq: Every day | ORAL | 1 refills | Status: DC

## 2017-04-20 NOTE — Progress Notes (Signed)
Subjective:  By signing my name below, I, Stann Oresung-Kai Tsai, attest that this documentation has been prepared under the direction and in the presence of Norberto SorensonEva Curly Mackowski, MD. Electronically Signed: Stann Oresung-Kai Tsai, Scribe. 04/20/2017 , 6:05 PM .  Patient was seen in Room 3 .   [name], male    DOB: 06/10/1966, 51 y.o.   MRN: [id number] Chief Complaint  Patient presents with  . Arthritis    RA    HPI John Polesracy W Lozon is a 51 y.o. male who presents to Primary Care at [facility] for follow up on arthritis.   Patient was seen for diffuse arthralgias in bilateral shoulders, left hand, right wrist, left hip and history of psoriasis. He was seen by rheumatology, Dr. Corliss Skainseveshwar, in Jan, and was started on methotrexate. He has responded very favorably to cortisone injections, and systematic cortisone prior. His left hip bursa was injected, suspected he had psoriatic arthritis, but chose to continue treatment with anti-inflammatories only for right now. He has cancelled his follow up appointment with Dr. Corliss Skainseveshwar.   Detailed lab work up and multiple imaging were obtained. His labs revealed only a mild elevation of his ALT, <20% above ULN and was told to avoid anti-inflammatories. Overall, rheumatologic labs were negative/normal. His xrays were reviewed, narrowing consistent with osteoarthritis. I'm currently prescribing patient Voltaren 75mg  BID prn, which he's been on for several years; as he's failed mobic and naproxen prior.    Patient states he's taking anti-inflammatories again, diclofenac + naproxen. If he doesn't take this combination at bed time, he'll need an additional hour in the mornings for him to function. When he doesn't take this combination, his shoulders become tender, and elbows are sore with telltale sign of left knuckles sore and swelling. He doesn't sleep well and waking up at night. He reports hips mostly tender when getting in and out of cars.   He states soreness still present in  his left shoulder, flared up about 3-4 days ago. He's taken prednisone in the past for relief over a couple months.   [place] He'll try to have the results sent and faxed over.   Past Medical History:  Diagnosis Date  . Anxiety   . Back problem   . HTN (hypertension)   . Hyperlipidemia   . NSTEMI (non-ST elevated myocardial infarction) (HCC) 1.1.2011   with two overlapping drug eluting stents mid lad  . Psoriasis    Prior to Admission medications   Medication Sig Start Date End Date Taking? Authorizing Provider  ALPRAZolam Prudy Feeler(XANAX) 0.5 MG tablet Take 1 tablet (0.5 mg total) by mouth at bedtime as needed for anxiety or sleep. Patient not taking: Reported on 09/05/2016 06/24/16   Sherren MochaShaw, Danay Mckellar N, MD  aspirin 325 MG EC tablet Take 325 mg by mouth daily.    [provider]  atenolol (TENORMIN) 50 MG tablet Take 1 tablet (50 mg total) by mouth 2 (two) times daily. 06/24/16   Sherren MochaShaw, Kynnedy Carreno N, MD  atorvastatin (LIPITOR) 20 MG tablet Take 1 tablet (20 mg total) by mouth daily at 6 PM. Need fasting cholesterol and liver labs checked before any further refills. 01/16/17   Sherren MochaShaw, Ayaan Shutes N, MD  clobetasol ointment (TEMOVATE) 0.05 % Apply 1 application topically 2 (two) times daily. 11/15/16   Sherren MochaShaw, Aeneas Longsworth N, MD  diclofenac (VOLTAREN) 75 MG EC tablet Take 1 tablet (75 mg total) by mouth 2 (two) times daily. 06/24/16  Sherren Mocha, MD  nitroGLYCERIN (NITROSTAT) 0.4 MG SL tablet DISSOLVE ONE TABLET UNDER THE TONGUE EVERY 5 MINUTES AS NEEDED FOR CHEST PAIN.  DO NOT EXCEED A TOTAL OF 3 DOSES IN 15 MINUTES Patient not taking: Reported on 09/05/2016 03/11/15   Ethelda Chick, MD   Allergies  Allergen Reactions  . Demerol [Meperidine]     Irregular heartbeat   . Morphine And Related Itching      Review of Systems  Constitutional: Negative for fatigue and unexpected weight change.  Eyes: Negative for visual disturbance.  Respiratory: Negative for cough, chest  tightness and shortness of breath.   Cardiovascular: Negative for chest pain, palpitations and leg swelling.  Gastrointestinal: Negative for abdominal pain and blood in stool.  Musculoskeletal: Positive for arthralgias and myalgias. Negative for back pain.  Skin: Negative for rash and wound.  Neurological: Negative for dizziness, weakness, light-headedness, numbness and headaches.       Objective:   Physical Exam  Constitutional: He is oriented to person, place, and time. He appears well-developed and well-nourished. No distress.  HENT:  Head: Normocephalic and atraumatic.  Eyes: Pupils are equal, round, and reactive to light. EOM are normal.  Neck: Neck supple. No thyromegaly present.  Cardiovascular: Normal rate, regular rhythm, S1 normal, S2 normal and normal heart sounds.   No murmur heard. Pulmonary/Chest: Effort normal and breath sounds normal. No respiratory distress. He has no wheezes.  Musculoskeletal: Normal range of motion.  Neurological: He is alert and oriented to person, place, and time.  Skin: Skin is warm and dry.  Psychiatric: He has a normal mood and affect. His behavior is normal.  Nursing note and vitals reviewed.   BP (!) 160/94   Pulse 61   Temp 97.9 F (36.6 C) (Oral)   Resp 18   Ht  (1.803 m)   Wt 261 lb (118.4 kg)   SpO2 95%   BMI 36.40 kg/m      Assessment & Plan:   1. Psoriatic arthritis (HCC)      Meds ordered this encounter  Medications  . predniSONE (DELTASONE) 20 MG tablet    Sig: Take 3 tabs po qd x 3d, then 2 tabs po qd x 3d, then 1 tab po qd x 3d, then 1/2 tab po qd x 4d    Dispense:  21 tablet    Refill:  0  . predniSONE (DELTASONE) 5 MG tablet    Sig: Take 1 tablet (5 mg total) by mouth daily with breakfast.    Dispense:  30 tablet    Refill:  1    I personally performed the services described in this documentation, which was scribed in my presence. The recorded information has been reviewed and considered, and addended  by me as needed.   Norberto Sorenson, M.D.  Primary Care at [facility] [address] [place], [place] [address] [phone] phone [phone] fax  04/23/17 5:16 PM

## 2017-04-20 NOTE — Patient Instructions (Addendum)
IF you received an x-ray today, you will receive an invoice from [facility]. Please contact [facility] at [phone] with questions or concerns regarding your invoice.   IF you received labwork today, you will receive an invoice from [facility]. Please contact [facility] at [phone] with questions or concerns regarding your invoice.   Our billing staff will not be able to assist you with questions regarding bills from these companies.  You will be contacted with the lab results as soon as they are available. The fastest way to get your results is to activate your My Chart account. Instructions are located on the last page of this paperwork. If you have not heard from us regarding the results in 2 weeks, please contact this office.     Prednisone tablets What is this medicine? PREDNISONE (PRED ni sone) is a corticosteroid. It is commonly used to treat inflammation of the skin, joints, lungs, and other organs. Common conditions treated include asthma, allergies, and arthritis. It is also used for other conditions, such as blood disorders and diseases of the adrenal glands. This medicine may be used for other purposes; ask your health care provider or pharmacist if you have questions. COMMON BRAND NAME(S): Deltasone, Predone, Sterapred, Sterapred DS What should I tell my health care provider before I take this medicine? They need to know if you have any of these conditions: -Cushing's syndrome -diabetes -glaucoma -heart disease -high blood pressure -infection (especially a virus infection such as chickenpox, cold sores, or herpes) -kidney disease -liver disease -mental illness -myasthenia gravis -osteoporosis -seizures -stomach or intestine problems -thyroid disease -an unusual or allergic reaction to lactose, prednisone, other medicines, foods, dyes, or preservatives -pregnant or trying to get pregnant -breast-feeding How should I use this medicine? Take  this medicine by mouth with a glass of water. Follow the directions on the prescription label. Take this medicine with food. If you are taking this medicine once a day, take it in the morning. Do not take more medicine than you are told to take. Do not suddenly stop taking your medicine because you may develop a severe reaction. Your doctor will tell you how much medicine to take. If your doctor wants you to stop the medicine, the dose may be slowly lowered over time to avoid any side effects. Talk to your pediatrician regarding the use of this medicine in children. Special care may be needed. Overdosage: If you think you have taken too much of this medicine contact a poison control center or emergency room at once. NOTE: This medicine is only for you. Do not share this medicine with others. What if I miss a dose? If you miss a dose, take it as soon as you can. If it is almost time for your next dose, talk to your doctor or health care professional. You may need to miss a dose or take an extra dose. Do not take double or extra doses without advice. What may interact with this medicine? Do not take this medicine with any of the following medications: -metyrapone -mifepristone This medicine may also interact with the following medications: -aminoglutethimide -amphotericin B -aspirin and aspirin-like medicines -barbiturates -certain medicines for diabetes, like glipizide or glyburide -cholestyramine -cholinesterase inhibitors -cyclosporine -digoxin -diuretics -ephedrine -male hormones, like estrogens and birth control pills -isoniazid -ketoconazole -NSAIDS, medicines for pain and inflammation, like ibuprofen or naproxen -phenytoin -rifampin -toxoids -vaccines -warfarin This list may not describe all possible interactions. Give your health care provider a list of all the  medicines, herbs, non-prescription drugs, or dietary supplements you use. Also tell them if you smoke, drink alcohol,  or use illegal drugs. Some items may interact with your medicine. What should I watch for while using this medicine? Visit your doctor or health care professional for regular checks on your progress. If you are taking this medicine over a prolonged period, carry an identification card with your name and address, the type and dose of your medicine, and your doctor's name and address. This medicine may increase your risk of getting an infection. Tell your doctor or health care professional if you are around anyone with measles or chickenpox, or if you develop sores or blisters that do not heal properly. If you are going to have surgery, tell your doctor or health care professional that you have taken this medicine within the last twelve months. Ask your doctor or health care professional about your diet. You may need to lower the amount of salt you eat. This medicine may affect blood sugar levels. If you have diabetes, check with your doctor or health care professional before you change your diet or the dose of your diabetic medicine. What side effects may I notice from receiving this medicine? Side effects that you should report to your doctor or health care professional as soon as possible: -allergic reactions like skin rash, itching or hives, swelling of the face, lips, or tongue -changes in emotions or moods -changes in vision -depressed mood -eye pain -fever or chills, cough, sore throat, pain or difficulty passing urine -increased thirst -swelling of ankles, feet Side effects that usually do not require medical attention (report to your doctor or health care professional if they continue or are bothersome): -confusion, excitement, restlessness -headache -nausea, vomiting -skin problems, acne, thin and shiny skin -trouble sleeping -weight gain This list may not describe all possible side effects. Call your doctor for medical advice about side effects. You may report side effects to FDA at  1-800-FDA-1088. Where should I keep my medicine? Keep out of the reach of children. Store at room temperature between 15 and 30 degrees C (59 and 86 degrees F). Protect from light. Keep container tightly closed. Throw away any unused medicine after the expiration date. NOTE: This sheet is a summary. It may not cover all possible information. If you have questions about this medicine, talk to your doctor, pharmacist, or health care provider.  2018 Elsevier/Gold Standard (2011-03-13 10:57:14)

## 2017-05-13 ENCOUNTER — Telehealth: Payer: Self-pay | Admitting: Family Medicine

## 2017-05-13 NOTE — Telephone Encounter (Signed)
PATIENT WOULD LIKE A REFILL ON PREDNISONE THE LAST DOSE HELPED WITH HIS ARTHRITIS

## 2017-05-13 NOTE — Telephone Encounter (Signed)
Message routed to Dr. Clelia Croft, per chart note, pt had prednisone around 2 mos ago. pls advise. thanks

## 2017-05-15 NOTE — Telephone Encounter (Signed)
Pt was put on a 13d prednisone course on 9/10 so would have just finished it 9/24. After that he was supposed to stay on prednisone  a day for the next 2 months - will have only been on this dose of 10d or so. To soon to do such another high dose prolonged course - if his sxs aren't reasonably controlled on the  a day - rec OV to discuss overall response and make a new plan.

## 2017-05-18 NOTE — Telephone Encounter (Signed)
Patient was transferred to make an appointment.

## 2017-05-21 ENCOUNTER — Ambulatory Visit: Payer: 59 | Admitting: Family Medicine

## 2017-05-28 ENCOUNTER — Ambulatory Visit (INDEPENDENT_AMBULATORY_CARE_PROVIDER_SITE_OTHER): Payer: 59 | Admitting: Family Medicine

## 2017-05-28 ENCOUNTER — Encounter: Payer: Self-pay | Admitting: Family Medicine

## 2017-05-28 VITALS — BP 116/68 | HR 67 | Temp 98.2°F | Resp 16 | Ht 71.0 in | Wt 255.0 lb

## 2017-05-28 DIAGNOSIS — L4050 Arthropathic psoriasis, unspecified: Secondary | ICD-10-CM | POA: Diagnosis not present

## 2017-05-28 DIAGNOSIS — M2550 Pain in unspecified joint: Secondary | ICD-10-CM

## 2017-05-28 MED ORDER — PREDNISONE 20 MG PO TABS: ORAL_TABLET | ORAL | 0 refills | Status: DC

## 2017-05-28 NOTE — Patient Instructions (Addendum)
Referral back to Dr. Link Snuffereveschwar, Rheumatologist  Continue Diclofenac 75 mg twice daily  Avoid aleve while on diclofenac  Take Tylenol 650 mg twice daily for additional pain relief  Wean from alcohol so you can be considered for other medications for the arthritis  Prednisone 20 mg tapered dose as directed again  Follow up with Dr. Clelia CroftShaw in a month   IF you received an x-ray today, you will receive an invoice from [facility]. Please contact [facility] at [phone] with questions or concerns regarding your invoice.   IF you received labwork today, you will receive an invoice from [facility]. Please contact [facility] at [phone] with questions or concerns regarding your invoice.   Our billing staff will not be able to assist you with questions regarding bills from these companies.  You will be contacted with the lab results as soon as they are available. The fastest way to get your results is to activate your My Chart account. Instructions are located on the last page of this paperwork. If you have not heard from us regarding the results in 2 weeks, please contact this office.

## 2017-05-28 NOTE — Progress Notes (Signed)
[name], male    DOB: 11/18/1965  Age: 51 y.o. MRN: [id number]  Chief Complaint  Patient presents with  . Arthritis    joint pain worse, was better on the 20mg  taper, when it was decreased to the 5mg  the pain came back, both hips, shoulders, and elbow are very tender     Subjective:   51 year old man who has psoriatic arthritis. He is with the [employer]. He can hardly get out of bed in the morn He takes 2 diclofenac at bedtime along with a couple of Aleve. He did not follow-up with the  Rheumatologist due to fear of side effects of methotrexate.  Drinks 3 or 4 beers in the evening.  Current allergies, medications, problem list, past/family and social histories reviewed.  Objective:  BP 116/68   Pulse 67   Temp 98.2 F (36.8 C)   Resp 16   Ht 5\' 11"  (1.803 m)   Wt 255 lb (115.7 kg)   SpO2 98%   BMI 35.57 kg/m   Joints hurt in the shoulders and hips and finger. No active inflammation. Good range of motion.  Assessment & Plan:   Assessment: No diagnosis found.    Plan: Long discussion about risk of medications including NSAIDS.  See instructions. Will treat one more round of prednisone to try and control him until he can see The rheumatologist. Told him that he must get on one of the medicines with bad side effect profile f he wants to get control of this  Because he cannot keep taking the prednisone.  No orders of the defined types were placed in this encounter.   No orders of the defined types were placed in this encounter.        Patient Instructions       IF you received an x-ray today, you will receive an invoice from [facility]. Please contact [facility] at [phone] with questions or concerns regarding your invoice.   IF you received labwork today, you will receive an invoice from [facility]. Please contact [facility] at [phone] with questions or concerns regarding your invoice.   Our billing staff will not  be able to assist you with questions regarding bills from these companies.  You will be contacted with the lab results as soon as they are available. The fastest way to get your results is to activate your My Chart account. Instructions are located on the last page of this paperwork. If you have not heard from us regarding the results in 2 weeks, please contact this office.         No Follow-up on file.   HOPPER,DAVID, MD 05/28/2017

## 2017-05-29 ENCOUNTER — Encounter: Payer: Self-pay | Admitting: Family Medicine

## 2017-05-29 LAB — COMPREHENSIVE METABOLIC PANEL
A/G RATIO: 1.4 (ref 1.2–2.2)
ALT: 57 IU/L — AB (ref 0–44)
AST: 32 IU/L (ref 0–40)
Albumin: 4.4 g/dL (ref 3.5–5.5)
Alkaline Phosphatase: 64 IU/L (ref 39–117)
BUN/Creatinine Ratio: 18 (ref 9–20)
BUN: 19 mg/dL (ref 6–24)
Bilirubin Total: 0.3 mg/dL (ref 0.0–1.2)
CALCIUM: 9.4 mg/dL (ref 8.7–10.2)
CO2: 23 mmol/L (ref 20–29)
CREATININE: 1.07 mg/dL (ref 0.76–1.27)
Chloride: 100 mmol/L (ref 96–106)
GFR, EST AFRICAN AMERICAN: 92 mL/min/{1.73_m2} (ref >59)
GFR, EST NON AFRICAN AMERICAN: 80 mL/min/{1.73_m2} (ref >59)
GLOBULIN, TOTAL: 3.1 g/dL (ref 1.5–4.5)
Glucose: 108 mg/dL — ABNORMAL HIGH (ref 65–99)
Potassium: 4.2 mmol/L (ref 3.5–5.2)
Sodium: 140 mmol/L (ref 134–144)
TOTAL PROTEIN: 7.5 g/dL (ref 6.0–8.5)

## 2017-05-29 LAB — CBC
HEMATOCRIT: 44.3 % (ref 37.5–51.0)
Hemoglobin: 15.4 g/dL (ref 13.0–17.7)
MCH: 32.6 pg (ref 26.6–33.0)
MCHC: 34.8 g/dL (ref 31.5–35.7)
MCV: 94 fL (ref 79–97)
Platelets: 190 10*3/uL (ref 150–379)
RBC: 4.72 x10E6/uL (ref 4.14–5.80)
RDW: 13.2 % (ref 12.3–15.4)
WBC: 8.9 10*3/uL (ref 3.4–10.8)

## 2017-05-29 LAB — SEDIMENTATION RATE: Sed Rate: 19 mm/hr (ref 0–30)

## 2017-06-17 ENCOUNTER — Other Ambulatory Visit: Payer: Self-pay | Admitting: Family Medicine

## 2017-06-17 NOTE — Telephone Encounter (Signed)
rx refill

## 2017-06-17 NOTE — Telephone Encounter (Signed)
Pt. Had elevated liver enzymes 05/28/17; req. Office to address refill

## 2017-06-19 ENCOUNTER — Ambulatory Visit: Payer: Self-pay | Admitting: *Deleted

## 2017-06-19 ENCOUNTER — Telehealth: Payer: Self-pay | Admitting: Family Medicine

## 2017-06-19 NOTE — Telephone Encounter (Signed)
Pt c/o left elbow pain from his RA.   First thing in the morning he can hardly move it.  The pain radiates from the elbow around to the outer part of the crease down his forearm and into his thumb.   This is an ongoing pain that is worse today.  I got him an appt for Nov. 12, 2018 at 3:20.  He acknowledged he could make this appt.  Reason for Disposition . [1] MODERATE pain (e.g., interferes with normal activities) AND [2] present > 3 days  Answer Assessment - Initial Assessment Questions 1. ONSET: "When did the pain start?"     It's been an ongoing pain.   I have RA. 2. LOCATION: "Where is the pain located?"     Left elbow hurts so bad if I don't use it.   It hurts really bad in the morning.   I can't hardly move it.   The pain radiates into the crease and down my forearm and into my thumb. 3. PAIN: "How bad is the pain?" (Scale 1-10; or mild, moderate, severe) - MILD - doesn't interfere with normal activities - MODERATE - interferes with normal activities (e.g., work or school) or awakens from sleep - SEVERE - excruciating pain, unable to do use arm at all     8-10 first thing in the morning. 4. WORK OR EXERCISE: "Has there been any recent work or exercise that involved this part of the body?"     No   Just immobility at night then first thing in the morning 5. CAUSE: "What do you think is causing the elbow pain?"     RA 6. OTHER SYMPTOMS: "Do you have any other symptoms?" (e.g., neck pain, elbow swelling, rash, fever)     No 7. PREGNANCY: "Is there any chance you are pregnant?" "When was your last menstrual period?"     N/A  Protocols used: ELBOW PAIN-A-AH

## 2017-06-19 NOTE — Telephone Encounter (Signed)
Please advise 

## 2017-06-19 NOTE — Telephone Encounter (Unsigned)
Copied from CRM #[id number]. Topic: Inquiry >> Jun 19, 2017  9:55 AM Raquel SarnaHayes, Teresa G wrote: Pt had blood work 1 month ago. Having pain in left arm elbow due to Rheumatoid arthritis - radiates down arm Protozoon helps a lot   Rheumatoid  Dr.  Medicine made him sick Anti inflammatory medicine - out Cholesterol medication - out BP medication- good for now

## 2017-06-21 NOTE — Progress Notes (Signed)
Subjective:    [name], male    DOB: [birth year], 51 y.o.   MRN: [id number] Chief Complaint  Patient presents with  . Arm Pain    left arm hurts worse in mornings can't use it in the first hour muscle tenderness from forearm to thumb  . Medication Refill    Lipitor, voltaren     HPI  John Esparza is a 51 yo male - works as an Garment/textile technologist in the [employer] - who has developed inflammatory arthritis (suspect psoriatic.)   He called into clinic 3d ago stating that he can hardly move his left elbow in the morning - pain radiates from elbow down radial aspect of forearm into his thumb and has been progressively worsening.  Pt was rx'ed a 13d prednisone taper (11mx3d then down by 20) on 9/10 (so completed ~9/24) and was instructed to then stay on prednisone 561mqd for maintenance/suppression (2 mo supply given). However, within just 9d of completing the high dose taper, his sxs again flaired and he requested a refill on the prednisone (but was denied as needed an OV to discuss other potential meds to trx the autoimmune disease with better effect and safety). Pt was then seen by my partner Dr. HoLinna Darner wks after his phone call (5 wks after his prior visit) as he could hardly get out of the bed in the morning despite taking 2 diclofenac and a couple of aleve before bed (along w/ 3-4 beers most evenings).  Dr. HoLinna Darnergain counseled pt on risks of overuse of nsaids and prednisone and agreed to rx one additional round of prednisone (6073m 3d, then taper by 22m96mery 2d till 10mg31md - so 15d course which would have been completed ~11/2) in hopes that that would be a sufficient dose/length to adequately control sxs until he could get a f/u appt w/ the rheumatologist - noting that continuing on freq high dose pred tapers is much less safe option than one of the DMARDs with a "bad side effect profile." Did have nml cbc, esr, and cmp at that visit (other than mildly elev glucose appropriate since pt  was not fasting, and mildly elev ALT.) Within a week of completing that pred taper, he had already called in with severe left elbow pain (see first P).   He did have xrays of his left shoulder and left hand 10 mos prior at Dr. DevesArlean Hoppingce that were relatively nml - minimal/mild narrowing/OA type changes only seen.  Saw rheumatology Dr. DevesEstanislado PandyiedmBaylis on 09/05/16 who did think pt most likely had psoriatic arthritis but after thinking more about the proposed trxs (methotrexate), pt was to concerned about potential side effects to proceed so never f/u.  Did have left trochanteric bursa injected w/ cortisol at that appt  Has had severe pain with pronation, gets much worse overnight.  Still w/ some tenderness in his joints, even his hips but not as bad as normal. No hand weakness, still no problems working out. When he warms up it is fine - doesn't bother him working out or at the [employer] he doesn't use it gets worse. No numbness,  Has been out of his nsaids and   HLD: Out of chol med. Last lipid panel 2.33 yrs prior was when pt was started on atorvastatin 20 as LDL 71->137 and non-HDL 119 -> 194 in just 3 mos time.  Pt has not come in fasting to OV or  the requested lab-only visit to have the lipids rechecked now that on statin since it was started.  HTN: BP meds good - atenolol 50  More pain with extending.   Past Medical History:  Diagnosis Date  . Anxiety   . Back problem   . HTN (hypertension)   . Hyperlipidemia   . NSTEMI (non-ST elevated myocardial infarction) ([place]) 1.1.2011   with two overlapping drug eluting stents mid lad  . Psoriasis    Past Surgical History:  Procedure Laterality Date  . APPENDECTOMY    . disk repla     L5-S1  . RETINAL DETACHMENT SURGERY  age 85   traumatic   Current Outpatient Medications on File Prior to Visit  Medication Sig Dispense Refill  . aspirin 325 MG EC tablet Take 325 mg by mouth daily.    Marland Kitchen atenolol (TENORMIN) 50 MG  tablet Take 1 tablet (50 mg total) by mouth 2 (two) times daily. 180 tablet 3  . atorvastatin (LIPITOR) 20 MG tablet TAKE 1 TABLET BY MOUTH ONCE DAILY AT 6 PM. NEED FASTING CHOLESTEROL AND AND LIVER LABS CHECKED BEFORE ANY FURTHER REFILLS 30 tablet 0  . clobetasol ointment (TEMOVATE) 7.41 % Apply 1 application topically 2 (two) times daily. 60 g 3  . nitroGLYCERIN (NITROSTAT) 0.4 MG SL tablet DISSOLVE ONE TABLET UNDER THE TONGUE EVERY 5 MINUTES AS NEEDED FOR CHEST PAIN.  DO NOT EXCEED A TOTAL OF 3 DOSES IN 15 MINUTES 20 tablet 11   No current facility-administered medications on file prior to visit.    Allergies  Allergen Reactions  . Demerol [Meperidine]     Irregular heartbeat   . Morphine And Related Itching   Family History  Problem Relation Age of Onset  . Hypertension Mother        with copd  . COPD Mother   . Heart disease Mother        AMI  . Heart attack Father   . Hypertension Father        with copd  . COPD Father   . Heart attack Brother        three brothers  . Heart disease Brother   . Lupus Sister        twin  . Cancer Sister        cervical cancer  . Coronary artery disease Brother        died following procedure  . Heart disease Brother   . Heart disease Brother    Social History   Socioeconomic History  . Marital status: Single    Spouse name: None  . Number of children: None  . Years of education: None  . Highest education level: None  Social Needs  . Financial resource strain: None  . Food insecurity - worry: None  . Food insecurity - inability: None  . Transportation needs - medical: None  . Transportation needs - non-medical: None  Occupational History  . None  Tobacco Use  . Smoking status: Never Smoker  . Smokeless tobacco: Never Used  Substance and Sexual Activity  . Alcohol use: Yes    Alcohol/week: 0.0 oz    Comment: drinks 12 packs of beer a week  . Drug use: No  . Sexual activity: No  Other Topics Concern  . None  Social  History Narrative   Marital status: single;       Children: 2 daughters; 1 grandson      Lives: with daughter, grandson  Employment:  Engineer, structural for [employer]; none      Alcohol:  Weekends      Exercise:  daily   Depression screen [facility] 2/9 05/28/2017 04/20/2017 08/09/2016 12/29/2015 10/22/2015  Decreased Interest 0 0 0 0 0  Down, Depressed, Hopeless 0 0 0 0 0  PHQ - 2 Score 0 0 0 0 0    Review of Systems See hpi    Objective:   Physical Exam  Constitutional: He is oriented to person, place, and time. He appears well-developed and well-nourished. No distress.  HENT:  Head: Normocephalic and atraumatic.  Eyes: No scleral icterus.  Pulmonary/Chest: Effort normal.  Neurological: He is alert and oriented to person, place, and time.  Skin: Skin is warm and dry. He is not diaphoretic.  Psychiatric: He has a normal mood and affect. His behavior is normal.   BP 132/82   Pulse 62   Temp 98.4 F (36.9 C)   Resp 16   Ht _0  (1.803 m)   Wt 252 lb (114.3 kg)   SpO2 97%   BMI 35.15 kg/m  Dg Elbow Complete Left (3+view)  Result Date: 06/22/2017 CLINICAL DATA:  severe ttp and stiffness in center of antecubital fossa with pain radiating distally down radial aspect of forearm, pain with pronation. EXAM: LEFT ELBOW - COMPLETE 3+ VIEW COMPARISON:  None. FINDINGS: There is no evidence of fracture, dislocation, or joint effusion. There is no evidence of arthropathy or other focal bone abnormality. Soft tissues are unremarkable. IMPRESSION: Negative. Electronically Signed   By: Lajean Manes M.D.   On: 06/22/2017 16:39       Assessment & Plan:  A1c, uric acid, vit D (none prior and the numerous courses of pred put pt at increased risk).  Due for lipids - cons d-LDL as likely won't be fasting ---  Cons left elbow xray? needs flu shot?  DDX: biceps tendinosis (resistened flexion and supination), pronator syndrome (tinels resistent pronation, weak  active flexion o f the index and long finger - papal sign) , anterior capsule strain  1. Psoriatic arthritis ([place])   2. Pure hypercholesterolemia   3. Left elbow pain   4. 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 Left elbow tendonitis     Orders Placed This Encounter  Procedures  . DG ELBOW COMPLETE LEFT (3+VIEW)    Standing Status:   Future    Number of Occurrences:   1    Standing Expiration Date:   06/22/2018    Order Specific Question:   Reason for Exam (SYMPTOM  OR DIAGNOSIS REQUIRED)    Answer:   severe ttp and stiffness in center of antecubital fossa with pain radiating distally down radial aspect of forearm, pain with pronation.    Order Specific Question:   Preferred imaging location?    Answer:   External  . Uric acid  . LDL Cholesterol, Direct  . VITAMIN D 25 Hydroxy (Vit-D Deficiency, Fractures)  . Ambulatory referral to Sports Medicine    Referral Priority:   Routine    Referral Type:   Consultation    Number of Visits Requested:   1  . POCT glycosylated hemoglobin (Hb A1C)    Meds ordered this encounter  Medications  . diclofenac (VOLTAREN) 75 MG EC tablet    Sig: Take 1 tablet (75 mg total) 2 (two) times daily by mouth.  Dispense:  60 tablet    Refill:  5  . predniSONE (DELTASONE) 20 MG tablet    Sig: Take 3 tabs qd x 3d, then 2 tabs qd x 3d then 1 tab qd x 3d, then 1/2 tab xd x 4d    Dispense:  20 tablet    Refill:  0     Delman Cheadle, M.D.  Primary Care at [facility] [address] [place], [place] [address] [phone] phone [phone] fax  06/24/17 11:14 PM

## 2017-06-22 ENCOUNTER — Other Ambulatory Visit: Payer: Self-pay

## 2017-06-22 ENCOUNTER — Encounter: Payer: Self-pay | Admitting: Family Medicine

## 2017-06-22 ENCOUNTER — Ambulatory Visit: Payer: 59 | Admitting: Family Medicine

## 2017-06-22 ENCOUNTER — Ambulatory Visit (INDEPENDENT_AMBULATORY_CARE_PROVIDER_SITE_OTHER): Payer: 59

## 2017-06-22 VITALS — BP 132/82 | HR 62 | Temp 98.4°F | Resp 16 | Ht 71.0 in | Wt 252.0 lb

## 2017-06-22 DIAGNOSIS — M778 Other enthesopathies, not elsewhere classified: Secondary | ICD-10-CM | POA: Diagnosis not present

## 2017-06-22 DIAGNOSIS — M25522 Pain in left elbow: Secondary | ICD-10-CM | POA: Diagnosis not present

## 2017-06-22 DIAGNOSIS — G5612 Other lesions of median nerve, left upper limb: Secondary | ICD-10-CM

## 2017-06-22 DIAGNOSIS — Z09 Encounter for follow-up examination after completed treatment for conditions other than malignant neoplasm: Secondary | ICD-10-CM | POA: Diagnosis not present

## 2017-06-22 DIAGNOSIS — L4050 Arthropathic psoriasis, unspecified: Secondary | ICD-10-CM

## 2017-06-22 DIAGNOSIS — E7800 Pure hypercholesterolemia, unspecified: Secondary | ICD-10-CM

## 2017-06-22 LAB — POCT GLYCOSYLATED HEMOGLOBIN (HGB A1C): Hemoglobin A1C: 5.8

## 2017-06-22 MED ORDER — PREDNISONE 20 MG PO TABS: ORAL_TABLET | ORAL | 0 refills | Status: DC

## 2017-06-22 MED ORDER — DICLOFENAC SODIUM 75 MG PO TBEC: 75.0000 mg | DELAYED_RELEASE_TABLET | Freq: Two times a day (BID) | ORAL | 5 refills | Status: DC

## 2017-06-22 NOTE — Patient Instructions (Addendum)
I think you are having pain in the pronator teres muscle.  Check out:  [email or web address]  However, it is also possible that your are having a bicep tendonosis.  I have referred you to sports medicine so we can pin down the cause exactly have get you doing the right rehab exercises.  IF you received an x-ray today, you will receive an invoice from [facility]. Please contact [facility] at [phone] with questions or concerns regarding your invoice.   IF you received labwork today, you will receive an invoice from [facility]. Please contact [facility] at [phone] with questions or concerns regarding your invoice.   Our billing staff will not be able to assist you with questions regarding bills from these companies.  You will be contacted with the lab results as soon as they are available. The fastest way to get your results is to activate your My Chart account. Instructions are located on the last page of this paperwork. If you have not heard from [place] regarding the results in 2 weeks, please contact this office.      Pronator Syndrome The median nerve is a nerve in your forearm that provides feeling to certain parts of your hand. Pronator syndrome is a condition that happens when the median nerve is squeezed (compressed) by a muscle or other structure. The condition can cause weakness or tingling in your thumb, index, middle, and ring fingers, or it can cause a dull ache or pain in your forearm. What are the causes? This condition may be caused by:  Overuse or repetitive motions that increase the size of a muscle in your forearm (pronator teres).  Trauma or a hard, direct hit (blow) to the forearm, resulting in swelling (hematoma).  A birth defect.  What increases the risk? This condition is more likely to develop in:  People who play sports such as baseball, tennis, or golf.  People who  have a job that requires grasping objects and twisting the forearm, such as carpentry.  Adults who are 39-59 years old.  Females.  What are the signs or symptoms? Symptoms of this condition include:  An ache or pain in the underside of your forearm close to the elbow.  A tingling or prickling feeling (sensation) in your index, middle, and ring finger as well as your thumb.  Weakness in your hand, particularly the pinch grip between your index finger and thumb.  Symptoms get worse with repetitive movement or rotation of the forearm. How is this diagnosed? This condition can be diagnosed based on your symptoms, your medical history, and a physical exam. During your exam, you may be asked to move your hand, fingers, wrist, and arm in certain ways. Doing this will help your health care provider find the source of your pain. You may also have tests, such as:  An electromyogram. This test can show how well the median nerve is working and show if there is too much pressure on it or a nearby nerve.  A nerve conduction study. This test measures how well electrical signals pass through your nerves.  An MRI. This test can show nerve problems.  An X-ray. This test may be done to check for an underlying problem, such as a break or crack in a bone.  An ultrasound. This may be done to check for an injury, such as a tear to a ligament or tendon.  How is this treated? This condition may be treated with:  Rest. You will need to limit activities that  cause your symptoms to get worse or flare up.  Steroid or anti-inflammatory medicine. These medicines may be prescribed to help with pain and other symptoms.  A splint or brace. You may need to wear a splint or brace for support until your symptoms improve.  Physical therapy. This involves doing hand and arm exercises.  Surgery. This is usually done only if other treatments fail and symptoms continue for more than 6 months.  Follow these  instructions at home: If you have a splint or brace:  Wear it as told by your health care provider. Remove it only as told by your health care provider.  Loosen the splint or brace if your fingers tingle, become numb, or turn cold and blue.  If your splint or brace is not waterproof: ? Do not let it get wet. ? Cover it with a watertight covering when you take a bath or a shower.  Keep the splint or brace clean. Activity  Return to your normal activities as told by your health care provider. Ask your health care provider what activities are safe for you.  Do exercises as told by your health care provider. General instructions  Do not use any tobacco products, such as cigarettes, chewing tobacco, or e-cigarettes. Tobacco can delay healing. If you need help quitting, ask your health care provider.  Take over-the-counter and prescription medicines only as told by your health care provider.  Keep all follow-up visits as told by your health care provider. This is important. How is this prevented?  Warm up and stretch before being active.  Cool down and stretch after being active.  Give your body time to rest between periods of activity.  Make sure to use equipment that fits you.  Be safe and responsible while being active to avoid falls.  Do at least 150 minutes of moderate-intensity exercise each week, such as brisk walking or water aerobics.  Maintain physical fitness, including: ? Strength. ? Flexibility. ? Cardiovascular fitness. ? Endurance. Contact a health care provider if:  Your symptoms do not improve in 4-6 weeks.  Your symptoms get worse. Get help right away if:  Your pain is severe.  You cannot move part of your hand or arm. This information is not intended to replace advice given to you by your health care provider. Make sure you discuss any questions you have with your health care provider. Document Released: 07/28/2005 Document Revised: 04/01/2016  Document Reviewed: 04/08/2015 Elsevier Interactive Patient Education  2018 [employer] Pronator Syndrome Rehab Ask your health care provider which exercises are safe for you. Do exercises exactly as told by your health care provider and adjust them as directed. It is normal to feel mild stretching, pulling, tightness, or discomfort as you do these exercises, but you should stop right away if you feel sudden pain or your pain gets worse.Do not begin these exercises until told by your health care provider. Stretching and range of motion exercises These exercises warm up your muscles and joints and improve the movement and flexibility of your forearm. These exercises also help to relieve pain, numbness, and tingling. Exercise A: Wrist flexion and extension, active 1. Bend your left / right elbow to an "L" shape (about 90 degrees). 2. Bend your wrist so your fingers point downward. 3. Gently bring your wrist up toward the ceiling. 4. Hold this position for __________ seconds. 5. Slowly return to the starting position. Repeat __________ times. Complete this exercise __________ times a day. Exercise B: Forearm rotation,  active 1. Stand with your left / right elbow at your side. 2. Bend your left / right elbow to an "L" shape (about 90 degrees). 3. Gently rotate your left / right forearm so your palm faces the floor. 4. Next, gently rotate your forearm so your palm faces the ceiling. 5. Go back and forth between the two rotation motions for __________ seconds. Repeat __________ times. Complete this exercise __________ times a day. Exercise C: Elbow flexion and extension, active 1. Stand with your left / right arm straight at your side. 2. Turn your left / right palm so it faces behind you. 3. Gently bend your left / right elbow so your palm faces the floor. 4. Hold this position for __________ seconds. 5. Slowly return to the starting position. Repeat __________ times. Complete this exercise  __________ times a day. Exercise D: Biceps stretch 1. Stand with your back to a sturdy chair. 2. Rest the back of your left / right hand on the back of the chair. Your elbow should be straight, and your palm should face the ceiling. 3. Slowly take 1-2 steps forward, stopping when you feel a gentle stretch in the top of your forearm or in your biceps. 4. Hold this position for __________ seconds. 5. Slowly return to the starting position. Repeat __________ times. Complete this exercise __________ times a day. Exercise E: Median nerve mobilization for pronator syndrome 1. Stand with your left / right elbow bent to an "L" shape (90 degrees) and your palm facing down. 2. Use your other hand to gently bend your wrist backwards. 3. Gently tilt your head so your left / right ear goes toward your left / right shoulder. As you tilt your head, gently straighten your elbow and allow your wrist to bend forward. 4. Hold this position for __________ seconds. 5. Slowly return to the starting position. Repeat __________ times. Complete this exercise __________ times a day. This information is not intended to replace advice given to you by your health care provider. Make sure you discuss any questions you have with your health care provider. Document Released: 07/28/2005 Document Revised: 04/01/2016 Document Reviewed: 04/08/2015 Elsevier Interactive Patient Education  2018 [employer]   Biceps Tendon Tendinitis (Distal) Distal biceps tendon tendinitis is inflammation of the distal biceps tendon. The distal biceps tendon is a strong cord of tissue that connects the biceps muscle, on the front of the upper arm, to a bone (radius) in the elbow. Distal biceps tendon tendinitis can interfere with the ability to bend the elbow and turn the hand palm-up (supination). This condition is usually caused by overusing the elbow joint and the biceps muscle, and it usually heals within 6 weeks. Distal biceps tendon  tendinitis may include a grade 1 or grade 2 strain of the tendon. A grade 1 strain is mild, and it involves a slight pull of the tendon without any stretching or noticeable tearing of the tendon. There is usually no loss of biceps muscle strength. A grade 2 strain is moderate, and it involves a small tear in the tendon. The tendon is stretched, and biceps muscle strength is usually decreased. What are the causes? This condition may be caused by:  A sudden increase in the frequency or intensity of activity that involves the elbow and the biceps muscle.  Overuse of the biceps muscle. This can happen when you do the same movements over and over, such as: ? Supination. ? Forceful straightening (hyperextension) of the elbow. ? Bending of the  elbow.  A direct, forceful hit or injury (trauma) to the elbow. This is rare.  What increases the risk? The following factors may make you more likely to develop this condition:  Playing contact sports.  Playing sports that involve throwing and overhead movements, including racket sports, gymnastics, weight lifting, or bodybuilding.  Doing physical labor.  Having poor strength and flexibility of the arm and shoulder.  Having injured other parts of the elbow.  What are the signs or symptoms? Symptoms of this condition may include:  Pain and inflammation in the front of the elbow. Pain may get worse during certain movements, such as: ? Supination. ? Bending the elbow. ? Lifting or carrying objects. ? Throwing.  A feeling of warmth in the front of the elbow.  A crackling sound (crepitation) when you move or touch the elbow or the upper arm.  In some cases, symptoms may return (recur) after treatment, and they may be long-lasting (chronic). How is this diagnosed? This condition is diagnosed based on your symptoms, your medical history, and a physical exam. You may have tests, including X-rays or MRIs. Your health care provider may test your range  of motion by having you do arm movements. How is this treated? This condition is treated by resting and icing the injured area, and by doing physical therapy exercises. Depending on the severity of your condition, treatment may also include:  Medicines to help relieve pain and inflammation.  Ultrasound therapy. This is the application of sound waves to the injured area.  Follow these instructions at home: Managing pain, stiffness, and swelling  If directed, put ice on the injured area: ? Put ice in a plastic bag. ? Place a towel between your skin and the bag. ? Leave the ice on for 20 minutes, 2-3 times a day.  Move your fingers often to avoid stiffness and to lessen swelling.  Raise (elevate) the injured area above the level of your heart while you are sitting or lying down.  If directed, apply heat to the affected area before you exercise. Use the heat source that your health care provider recommends, such as a moist heat pack or a heating pad. ? Place a towel between your skin and the heat source. ? Leave the heat on for 20-30 minutes. ? Remove the heat if your skin turns bright red. This is especially important if you are unable to feel pain, heat, or cold. You may have a greater risk of getting burned. Activity  Return to your normal activities as told by your health care provider. Ask your health care provider what activities are safe for you.  Do not lift anything that is heavier than 10 lb (4.5 kg) until your health care provider tells you that it is safe.  Avoid activities that cause pain or make your condition worse.  Do exercises as told by your health care provider. General instructions  Take over-the-counter and prescription medicines only as told by your health care provider.  Do not drive or operate heavy machinery while taking prescription pain medicines.  Keep all follow-up visits as told by your health care provider. This is important. How is this  prevented?  Warm up and stretch before being active.  Cool down and stretch after being active.  Give your body time to rest between periods of activity.  Make sure to use equipment that fits you.  Be safe and responsible while being active to avoid falls.  Do at least 150 minutes of moderate-intensity  exercise each week, such as brisk walking or water aerobics.  Maintain physical fitness, including: ? Strength. ? Flexibility. ? Cardiovascular fitness. ? Endurance. Contact a health care provider if:  You have symptoms that get worse or do not get better after 2 weeks of treatment.  You develop new symptoms. Get help right away if:  You develop severe pain. This information is not intended to replace advice given to you by your health care provider. Make sure you discuss any questions you have with your health care provider. Document Released: 07/28/2005 Document Revised: 04/03/2016 Document Reviewed: 07/06/2015 Elsevier Interactive Patient Education  2018 [employer] Inc.   Biceps Tendon Tendinitis (Distal) Rehab Ask your health care provider which exercises are safe for you. Do exercises exactly as told by your health care provider and adjust them as directed. It is normal to feel mild stretching, pulling, tightness, or discomfort as you do these exercises, but you should stop right away if you feel sudden pain or your pain gets worse.Do not begin these exercises until told by your health care provider. Stretching and range of motion exercises These exercises warm up your muscles and joints and improve the movement and flexibility of your arm. These exercises can also help to relieve pain and stiffness. Exercise A: Supination, active-assisted 1. Stand or sit with your left / right elbow bent to an "L" shape (90 degrees). 2. Rotate your palm up until you cannot rotate it anymore. Then, use your other hand to help rotate your left / right forearm more. 3. Hold this position for  __________ seconds. 4. Slowly return to the starting position. Repeat __________ times. Complete this exercise __________ times a day. Exercise B: Pronation, active-assisted  1. Stand or sit with your left / right elbow bent to an "L" shape (90 degrees). 2. Rotate your left / right palm down until you cannot rotate it anymore. Then, use your other hand to help rotate your left / right forearm more. 3. Hold this position for __________ seconds. 4. Slowly return to the starting position. Repeat __________ times. Complete this exercise __________ times a day. Strengthening exercises These exercises build strength and endurance in your arm and shoulder. Endurance is the ability to use your muscles for a long time, even after your muscles get tired. Exercise C: Supination  1. Sit with your left / right forearm supported on a table. Your elbow should be at waist height. 2. Rest your hand over the edge of the table, palm-down. 3. Gently grasp a lightweight hammer near the head. As this exercise gets easier for you, try holding the hammer farther down the handle. 4. Without moving your elbow, slowly rotate your palm up so it faces the ceiling. 5. Hold this position for__________ seconds. 6. Slowly return to the starting position. Repeat __________ times. Complete this exercise __________ times a day. Exercise D: Pronation  1. Sit with your left / right forearm supported on a table. Your elbow should be at waist height. 2. Rest your hand over the edge of the table, palm-up. 3. Gently grasp a lightweight hammer near the head. As this exercise gets easier for you, try holding the hammer farther down the handle. 4. Without moving your elbow, slowly rotate your palm down. 5. Hold this position for __________ seconds. 6. Slowly return to the starting position. Repeat __________ times. Complete this exercise __________ times a day. Exercise E: Elbow flexion, supinated  1. Sit on a stable chair  without armrests, or stand. 2. If  directed, hold a __________ weight in your left / right hand, or hold an exercise band with both hands. Your palms should face up toward the ceiling at the starting position. 3. Bend your left / right elbow and move your hand up toward your shoulder. Keep your other arm straight down, in the starting position. 4. Slowly return to the starting position. Repeat __________ times. Complete this exercise __________ times a day. Exercise F: Elbow extension  1. Lie on your back. 2. Hold a __________ weight in your left / right hand. 3. Bend your left / right elbow to an "L" shape (90 degrees) so your elbow is pointed up to the ceiling and the weight is overhead. 4. Straighten your elbow, raising your hand toward the ceiling. Use your other hand to support your left / right upper arm and to keep it still. 5. Slowly return to the starting position. Repeat __________ times. Complete this exercise __________ times a day. This information is not intended to replace advice given to you by your health care provider. Make sure you discuss any questions you have with your health care provider. Document Released: 07/28/2005 Document Revised: 04/03/2016 Document Reviewed: 07/06/2015 Elsevier Interactive Patient Education  [employer].

## 2017-06-22 NOTE — Telephone Encounter (Signed)
Patient has appointment today

## 2017-06-23 LAB — LDL CHOLESTEROL, DIRECT: LDL Direct: 118 mg/dL — ABNORMAL HIGH (ref 0–99)

## 2017-06-23 LAB — VITAMIN D 25 HYDROXY (VIT D DEFICIENCY, FRACTURES): Vit D, 25-Hydroxy: 23.5 ng/mL — ABNORMAL LOW (ref 30.0–100.0)

## 2017-06-23 LAB — URIC ACID: Uric Acid: 6.8 mg/dL (ref 3.7–8.6)

## 2017-07-06 ENCOUNTER — Other Ambulatory Visit: Payer: Self-pay | Admitting: *Deleted

## 2017-07-06 ENCOUNTER — Other Ambulatory Visit: Payer: Self-pay | Admitting: Family Medicine

## 2017-07-06 MED ORDER — ATENOLOL 50 MG PO TABS: 50.0000 mg | ORAL_TABLET | Freq: Two times a day (BID) | ORAL | 0 refills | Status: DC

## 2017-07-22 ENCOUNTER — Telehealth: Payer: Self-pay | Admitting: Family Medicine

## 2017-07-22 NOTE — Telephone Encounter (Signed)
Spoke with pt and let him know [facility] would [facility] Gave him their phone number and location and told him to give them a call if he has not heard anything early next week.

## 2017-07-22 NOTE — Telephone Encounter (Signed)
Copied from CRM [phone]. Topic: Quick Communication - See Telephone Encounter >> Jul 22, 2017 10:13 AM Guinevere FerrariMorris, Wilsie Kern E, NT wrote: CRM for notification. See Telephone encounter for: Pt. Is calling because he was referred to a rheumatologist and the earliest they could see him was in April. Pt wants to know if there is something that could be done to be seen earlier. Pt said he have trouble getting out of the bed most mornings. Pt. Would like a call back  12/12

## 2017-07-22 NOTE — Telephone Encounter (Signed)
[facility] to see if [facility]t can be scheduled sooner.

## 2017-07-31 DIAGNOSIS — M79642 Pain in left hand: Secondary | ICD-10-CM | POA: Diagnosis not present

## 2017-07-31 DIAGNOSIS — L4050 Arthropathic psoriasis, unspecified: Secondary | ICD-10-CM | POA: Diagnosis not present

## 2017-07-31 DIAGNOSIS — M545 Low back pain: Secondary | ICD-10-CM | POA: Diagnosis not present

## 2017-07-31 DIAGNOSIS — M79643 Pain in unspecified hand: Secondary | ICD-10-CM | POA: Diagnosis not present

## 2017-07-31 DIAGNOSIS — L409 Psoriasis, unspecified: Secondary | ICD-10-CM | POA: Diagnosis not present

## 2017-08-28 DIAGNOSIS — L409 Psoriasis, unspecified: Secondary | ICD-10-CM | POA: Diagnosis not present

## 2017-08-28 DIAGNOSIS — L4050 Arthropathic psoriasis, unspecified: Secondary | ICD-10-CM | POA: Diagnosis not present

## 2017-08-28 DIAGNOSIS — M79643 Pain in unspecified hand: Secondary | ICD-10-CM | POA: Diagnosis not present

## 2017-08-30 ENCOUNTER — Other Ambulatory Visit: Payer: Self-pay | Admitting: Family Medicine

## 2017-09-05 ENCOUNTER — Encounter: Payer: Self-pay | Admitting: Family Medicine

## 2017-09-05 ENCOUNTER — Ambulatory Visit: Payer: 59 | Admitting: Family Medicine

## 2017-09-05 ENCOUNTER — Other Ambulatory Visit: Payer: Self-pay

## 2017-09-05 VITALS — BP 122/82 | HR 65 | Temp 98.2°F | Resp 18 | Ht 71.0 in | Wt 258.0 lb

## 2017-09-05 DIAGNOSIS — M25529 Pain in unspecified elbow: Secondary | ICD-10-CM | POA: Diagnosis not present

## 2017-09-05 DIAGNOSIS — M25532 Pain in left wrist: Secondary | ICD-10-CM

## 2017-09-05 MED ORDER — PREDNISONE 20 MG PO TABS: ORAL_TABLET | ORAL | 0 refills | Status: DC

## 2017-09-05 MED ORDER — OXYCODONE-ACETAMINOPHEN 5-325 MG PO TABS: 1.0000 | ORAL_TABLET | Freq: Four times a day (QID) | ORAL | 0 refills | Status: DC | PRN

## 2017-09-05 MED ORDER — METHYLPREDNISOLONE SODIUM SUCC 125 MG IJ SOLR
125.0000 mg | Freq: Once | INTRAMUSCULAR | Status: AC
  Administered 2017-09-05: 125 mg via INTRAMUSCULAR

## 2017-09-05 NOTE — Progress Notes (Signed)
Subjective:    [name], male    DOB: 05/30/1966, 52 y.o.   MRN: [id number] Chief Complaint  Patient presents with  . Elbow Pain    pt states his elbow has been giving him severe pain for months. Pt states it is more on the left elbow to the point where it has become unbarable.     HPI  Hips stay sore. Shoulder and elbo. Now Left lateral hip stays stender but only hurts when he rotates leg. The elbow hurts constantly whenever he does anything - stays at 3-4 constantly - cant evev turn a stearing wheel for the last 3d. Normally it is sore and he tolerates but w/o use it gets more sore and he to work it out but now it is constant - is in lateral elbow.  Nothing I On the 3rd week of methotrexate w/o any benefit - not helping - so mihgt increase dose or try an effusion. Off meloxicam when he started methotrexate. And he was on a tiny dose daily of prednison for 30d which didn't help and may have caused queasiness.  He is taking aleve 2 twice a day.   When he moves his wrist he doesn't have any pain but is waking up every 2 hours. Has pain in legs as well at night when he sleeps.  He did receive a   Past Medical History:  Diagnosis Date  . Anxiety   . Back problem   . HTN (hypertension)   . Hyperlipidemia   . NSTEMI (non-ST elevated myocardial infarction) (HCC) 1.1.2011   with two overlapping drug eluting stents mid lad  . Psoriasis    Past Surgical History:  Procedure Laterality Date  . APPENDECTOMY    . disk repla     L5-S1  . RETINAL DETACHMENT SURGERY  age 52   traumatic   Current Outpatient Medications on File Prior to Visit  Medication Sig Dispense Refill  . aspirin 325 MG EC tablet Take 325 mg by mouth daily.    Marland Kitchen. atenolol (TENORMIN) 50 MG tablet Take 1 tablet (50 mg total) by mouth 2 (two) times daily. 180 tablet 0  . atorvastatin (LIPITOR) 20 MG tablet TAKE 1 TABLET BY MOUTH ONCE DAILY AT  6PM. NEED FASTING CHOLESTEROL AND LIVER LABS CHECKED BEFORE  FURTHER REFILLS ARE GIVEN. 30 tablet 0  . clobetasol ointment (TEMOVATE) 0.05 % Apply 1 application topically 2 (two) times daily. 60 g 3  . folic acid (FOLVITE) 1 MG tablet     . methotrexate 2.5 MG tablet     . nitroGLYCERIN (NITROSTAT) 0.4 MG SL tablet DISSOLVE ONE TABLET UNDER THE TONGUE EVERY 5 MINUTES AS NEEDED FOR CHEST PAIN.  DO NOT EXCEED A TOTAL OF 3 DOSES IN 15 MINUTES 20 tablet 11   No current facility-administered medications on file prior to visit.    Allergies  Allergen Reactions  . Demerol [Meperidine]     Irregular heartbeat   . Morphine And Related Itching   Family History  Problem Relation Age of Onset  . Hypertension Mother        with copd  . COPD Mother   . Heart disease Mother        AMI  . Heart attack Father   . Hypertension Father        with copd  . COPD Father   . Heart attack Brother        three brothers  . Heart disease Brother   .  Lupus Sister        twin  . Cancer Sister        cervical cancer  . Coronary artery disease Brother        died following procedure  . Heart disease Brother   . Heart disease Brother    Social History   Socioeconomic History  . Marital status: Single    Spouse name: None  . Number of children: None  . Years of education: None  . Highest education level: None  Social Needs  . Financial resource strain: None  . Food insecurity - worry: None  . Food insecurity - inability: None  . Transportation needs - medical: None  . Transportation needs - non-medical: None  Occupational History  . None  Tobacco Use  . Smoking status: Never Smoker  . Smokeless tobacco: Never Used  Substance and Sexual Activity  . Alcohol use: Yes    Alcohol/week: 0.0 oz    Comment: drinks 12 packs of beer a week  . Drug use: No  . Sexual activity: No  Other Topics Concern  . None  Social History Narrative   Marital status: single;       Children: 2 daughters; 1 grandson      Lives: with daughter, grandson      Employment:   Emergency planning/management officer for [employer]      Tobacco; none      Alcohol:  Weekends      Exercise:  daily   Depression screen [facility] 2/9 09/05/2017 05/28/2017 04/20/2017 08/09/2016 12/29/2015  Decreased Interest 0 0 0 0 0  Down, Depressed, Hopeless 0 0 0 0 0  PHQ - 2 Score 0 0 0 0 0     Review of Systems  Constitutional: Positive for activity change. Negative for unexpected weight change.  Gastrointestinal: Negative for abdominal pain, blood in stool, nausea and vomiting.  Musculoskeletal: Positive for arthralgias and myalgias. Negative for gait problem and joint swelling.  Skin: Positive for rash (mild psoriasis at baseline). Negative for color change and wound.  Neurological: Negative for weakness and numbness.  Hematological: Negative for adenopathy.  Psychiatric/Behavioral: Positive for sleep disturbance. Negative for dysphoric mood. The patient is not nervous/anxious.        Objective:   Physical Exam  Constitutional: He is oriented to person, place, and time. He appears well-developed and well-nourished. No distress.  HENT:  Head: Normocephalic and atraumatic.  Eyes: No scleral icterus.  Pulmonary/Chest: Effort normal.  Musculoskeletal:       Left elbow: He exhibits normal range of motion, no swelling, no effusion and no deformity. Tenderness found. Lateral epicondyle (mild) tenderness noted.       Left wrist: He exhibits normal range of motion, no tenderness, no bony tenderness and no swelling.  Severe pain with pronation and extension of elbow (reaching behind him).  Mild tenderness to palpation over lateral epicondyle but not induce or replicate the shooting pain. No tenderness over palpation of radius and ulnar. No symptom reproduction with wrist flexion/extension    Neurological: He is alert and oriented to person, place, and time.  Skin: Skin is warm and dry. He is not diaphoretic.  Psychiatric: He has a normal mood and affect. His behavior is normal.          BP 122/82   Pulse 65   Temp 98.2 F (36.8 C) (Oral)   Resp 18   Ht 5\' 11"  (1.803 m)   Wt 258 lb (117 kg)  SpO2 96%   BMI 35.98 kg/m   Assessment & Plan:  DDX: biceps tendinosis (resistened flexion and supination), pronator syndrome (tinels resistent pronation, weak active flexion o f the index and long finger - papal sign) , anterior capsule strain  1. Localized pain of elbow joint   2. Arthralgia of left forearm   Pt RTC for severe worsening of pain along the radial aspect of his left forearm. I initially evaluated pt for this pain on 06/22/17 and referred him to sports medicine as I was unsure of the diagnosis. However, he did not go as sxs improved with prednisone and he started seeing rheum for the psoriatic arthritis.   Concerned prednisone won't be effective again as just received IM injection last wk at rheum office. 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 Try SoluMedrol IM injection, start prednisone taper tomorrow if it helps some.  Try 2 tabs oxycodone before bed at night as not sleeping due to pain. Refer to ortho for diagnosis/management - I suspect he may need work/lifting restrictions and PT but will defer to ortho as I have not had any luck in getting pt to comply with restrictions and home exercises due to the high risk and high intensity nature of his job.  Orders Placed This Encounter  Procedures  . Ambulatory referral to Orthopedic Surgery    Referral Priority:   Routine    Referral Type:   Surgical    Referral Reason:   Specialty Services Required    Requested Specialty:   Orthopedic Surgery    Number of Visits Requested:   1    Meds ordered this encounter  Medications  . predniSONE (DELTASONE) 20 MG tablet    Sig: Take 3 tabs qd  x 3d, then 2 tabs qd x 3d then 1 tab qd x 3d, then 1/2 tab xd x 4d    Dispense:  20 tablet    Refill:  0  . methylPREDNISolone sodium succinate (SOLU-MEDROL) 125 mg/2 mL injection 125 mg  . oxyCODONE-acetaminophen (PERCOCET/ROXICET) 5-325 MG tablet    Sig: Take 1 tablet by mouth every 6 (six) hours as needed for severe pain.    Dispense:  20 tablet    Refill:  0     Norberto Sorenson, M.D.  Primary Care at [facility] [address] [place], [place] [address] [phone] phone [phone] fax  09/06/17 11:19 PM

## 2017-09-05 NOTE — Patient Instructions (Addendum)
IF you received an x-ray today, you will receive an invoice from [facility]. Please contact [facility] at [phone] with questions or concerns regarding your invoice.   IF you received labwork today, you will receive an invoice from [facility]. Please contact [facility] at [phone] with questions or concerns regarding your invoice.   Our billing staff will not be able to assist you with questions regarding bills from these companies.  You will be contacted with the lab results as soon as they are available. The fastest way to get your results is to activate your My Chart account. Instructions are located on the last page of this paperwork. If you have not heard from [place] regarding the results in 2 weeks, please contact this office.     Biceps Tendon Tendinitis (Distal) Distal biceps tendon tendinitis is inflammation of the distal biceps tendon. The distal biceps tendon is a strong cord of tissue that connects the biceps muscle, on the front of the upper arm, to a bone (radius) in the elbow. Distal biceps tendon tendinitis can interfere with the ability to bend the elbow and turn the hand palm-up (supination). This condition is usually caused by overusing the elbow joint and the biceps muscle, and it usually heals within 6 weeks. Distal biceps tendon tendinitis may include a grade 1 or grade 2 strain of the tendon. A grade 1 strain is mild, and it involves a slight pull of the tendon without any stretching or noticeable tearing of the tendon. There is usually no loss of biceps muscle strength. A grade 2 strain is moderate, and it involves a small tear in the tendon. The tendon is stretched, and biceps muscle strength is usually decreased. What are the causes? This condition may be caused by:  A sudden increase in the frequency or intensity of activity that involves the elbow and the biceps muscle.  Overuse of the biceps muscle. This can happen when you do the same  movements over and over, such as: ? Supination. ? Forceful straightening (hyperextension) of the elbow. ? Bending of the elbow.  A direct, forceful hit or injury (trauma) to the elbow. This is rare.  What increases the risk? The following factors may make you more likely to develop this condition:  Playing contact sports.  Playing sports that involve throwing and overhead movements, including racket sports, gymnastics, weight lifting, or bodybuilding.  Doing physical labor.  Having poor strength and flexibility of the arm and shoulder.  Having injured other parts of the elbow.  What are the signs or symptoms? Symptoms of this condition may include:  Pain and inflammation in the front of the elbow. Pain may get worse during certain movements, such as: ? Supination. ? Bending the elbow. ? Lifting or carrying objects. ? Throwing.  A feeling of warmth in the front of the elbow.  A crackling sound (crepitation) when you move or touch the elbow or the upper arm.  In some cases, symptoms may return (recur) after treatment, and they may be long-lasting (chronic). How is this diagnosed? This condition is diagnosed based on your symptoms, your medical history, and a physical exam. You may have tests, including X-rays or MRIs. Your health care provider may test your range of motion by having you do arm movements. How is this treated? This condition is treated by resting and icing the injured area, and by doing physical therapy exercises. Depending on the severity of your condition, treatment may also include:  Medicines to help  relieve pain and inflammation.  Ultrasound therapy. This is the application of sound waves to the injured area.  Follow these instructions at home: Managing pain, stiffness, and swelling  If directed, put ice on the injured area: ? Put ice in a plastic bag. ? Place a towel between your skin and the bag. ? Leave the ice on for 20 minutes, 2-3 times a  day.  Move your fingers often to avoid stiffness and to lessen swelling.  Raise (elevate) the injured area above the level of your heart while you are sitting or lying down.  If directed, apply heat to the affected area before you exercise. Use the heat source that your health care provider recommends, such as a moist heat pack or a heating pad. ? Place a towel between your skin and the heat source. ? Leave the heat on for 20-30 minutes. ? Remove the heat if your skin turns bright red. This is especially important if you are unable to feel pain, heat, or cold. You may have a greater risk of getting burned. Activity  Return to your normal activities as told by your health care provider. Ask your health care provider what activities are safe for you.  Do not lift anything that is heavier than 10 lb (4.5 kg) until your health care provider tells you that it is safe.  Avoid activities that cause pain or make your condition worse.  Do exercises as told by your health care provider. General instructions  Take over-the-counter and prescription medicines only as told by your health care provider.  Do not drive or operate heavy machinery while taking prescription pain medicines.  Keep all follow-up visits as told by your health care provider. This is important. How is this prevented?  Warm up and stretch before being active.  Cool down and stretch after being active.  Give your body time to rest between periods of activity.  Make sure to use equipment that fits you.  Be safe and responsible while being active to avoid falls.  Do at least 150 minutes of moderate-intensity exercise each week, such as brisk walking or water aerobics.  Maintain physical fitness, including: ? Strength. ? Flexibility. ? Cardiovascular fitness. ? Endurance. Contact a health care provider if:  You have symptoms that get worse or do not get better after 2 weeks of treatment.  You develop new  symptoms. Get help right away if:  You develop severe pain. This information is not intended to replace advice given to you by your health care provider. Make sure you discuss any questions you have with your health care provider. Document Released: 07/28/2005 Document Revised: 04/03/2016 Document Reviewed: 07/06/2015 Elsevier Interactive Patient Education  2018 [employer]  Biceps Tendon Tendinitis (Distal) Rehab Ask your health care provider which exercises are safe for you. Do exercises exactly as told by your health care provider and adjust them as directed. It is normal to feel mild stretching, pulling, tightness, or discomfort as you do these exercises, but you should stop right away if you feel sudden pain or your pain gets worse.Do not begin these exercises until told by your health care provider. Stretching and range of motion exercises These exercises warm up your muscles and joints and improve the movement and flexibility of your arm. These exercises can also help to relieve pain and stiffness. Exercise A: Supination, active-assisted 1. Stand or sit with your left / right elbow bent to an "L" shape (90 degrees). 2. Rotate your palm up until  you cannot rotate it anymore. Then, use your other hand to help rotate your left / right forearm more. 3. Hold this position for __________ seconds. 4. Slowly return to the starting position. Repeat __________ times. Complete this exercise __________ times a day. Exercise B: Pronation, active-assisted  1. Stand or sit with your left / right elbow bent to an "L" shape (90 degrees). 2. Rotate your left / right palm down until you cannot rotate it anymore. Then, use your other hand to help rotate your left / right forearm more. 3. Hold this position for __________ seconds. 4. Slowly return to the starting position. Repeat __________ times. Complete this exercise __________ times a day. Strengthening exercises These exercises build strength  and endurance in your arm and shoulder. Endurance is the ability to use your muscles for a long time, even after your muscles get tired. Exercise C: Supination  1. Sit with your left / right forearm supported on a table. Your elbow should be at waist height. 2. Rest your hand over the edge of the table, palm-down. 3. Gently grasp a lightweight hammer near the head. As this exercise gets easier for you, try holding the hammer farther down the handle. 4. Without moving your elbow, slowly rotate your palm up so it faces the ceiling. 5. Hold this position for__________ seconds. 6. Slowly return to the starting position. Repeat __________ times. Complete this exercise __________ times a day. Exercise D: Pronation  1. Sit with your left / right forearm supported on a table. Your elbow should be at waist height. 2. Rest your hand over the edge of the table, palm-up. 3. Gently grasp a lightweight hammer near the head. As this exercise gets easier for you, try holding the hammer farther down the handle. 4. Without moving your elbow, slowly rotate your palm down. 5. Hold this position for __________ seconds. 6. Slowly return to the starting position. Repeat __________ times. Complete this exercise __________ times a day. Exercise E: Elbow flexion, supinated  1. Sit on a stable chair without armrests, or stand. 2. If directed, hold a __________ weight in your left / right hand, or hold an exercise band with both hands. Your palms should face up toward the ceiling at the starting position. 3. Bend your left / right elbow and move your hand up toward your shoulder. Keep your other arm straight down, in the starting position. 4. Slowly return to the starting position. Repeat __________ times. Complete this exercise __________ times a day. Exercise F: Elbow extension  1. Lie on your back. 2. Hold a __________ weight in your left / right hand. 3. Bend your left / right elbow to an "L" shape (90  degrees) so your elbow is pointed up to the ceiling and the weight is overhead. 4. Straighten your elbow, raising your hand toward the ceiling. Use your other hand to support your left / right upper arm and to keep it still. 5. Slowly return to the starting position. Repeat __________ times. Complete this exercise __________ times a day. This information is not intended to replace advice given to you by your health care provider. Make sure you discuss any questions you have with your health care provider. Document Released: 07/28/2005 Document Revised: 04/03/2016 Document Reviewed: 07/06/2015 Elsevier Interactive Patient Education  2018 [employer].   Pronator Syndrome Rehab Ask your health care provider which exercises are safe for you. Do exercises exactly as told by your health care provider and adjust them as directed. It is  normal to feel mild stretching, pulling, tightness, or discomfort as you do these exercises, but you should stop right away if you feel sudden pain or your pain gets worse.Do not begin these exercises until told by your health care provider. Stretching and range of motion exercises These exercises warm up your muscles and joints and improve the movement and flexibility of your forearm. These exercises also help to relieve pain, numbness, and tingling. Exercise A: Wrist flexion and extension, active 1. Bend your left / right elbow to an "L" shape (about 90 degrees). 2. Bend your wrist so your fingers point downward. 3. Gently bring your wrist up toward the ceiling. 4. Hold this position for __________ seconds. 5. Slowly return to the starting position. Repeat __________ times. Complete this exercise __________ times a day. Exercise B: Forearm rotation, active 1. Stand with your left / right elbow at your side. 2. Bend your left / right elbow to an "L" shape (about 90 degrees). 3. Gently rotate your left / right forearm so your palm faces the floor. 4. Next, gently  rotate your forearm so your palm faces the ceiling. 5. Go back and forth between the two rotation motions for __________ seconds. Repeat __________ times. Complete this exercise __________ times a day. Exercise C: Elbow flexion and extension, active 1. Stand with your left / right arm straight at your side. 2. Turn your left / right palm so it faces behind you. 3. Gently bend your left / right elbow so your palm faces the floor. 4. Hold this position for __________ seconds. 5. Slowly return to the starting position. Repeat __________ times. Complete this exercise __________ times a day. Exercise D: Biceps stretch 1. Stand with your back to a sturdy chair. 2. Rest the back of your left / right hand on the back of the chair. Your elbow should be straight, and your palm should face the ceiling. 3. Slowly take 1-2 steps forward, stopping when you feel a gentle stretch in the top of your forearm or in your biceps. 4. Hold this position for __________ seconds. 5. Slowly return to the starting position. Repeat __________ times. Complete this exercise __________ times a day. Exercise E: Median nerve mobilization for pronator syndrome 1. Stand with your left / right elbow bent to an "L" shape (90 degrees) and your palm facing down. 2. Use your other hand to gently bend your wrist backwards. 3. Gently tilt your head so your left / right ear goes toward your left / right shoulder. As you tilt your head, gently straighten your elbow and allow your wrist to bend forward. 4. Hold this position for __________ seconds. 5. Slowly return to the starting position. Repeat __________ times. Complete this exercise __________ times a day. This information is not intended to replace advice given to you by your health care provider. Make sure you discuss any questions you have with your health care provider. Document Released: 07/28/2005 Document Revised: 04/01/2016 Document Reviewed: 04/08/2015 Elsevier  Interactive Patient Education  [employer].

## 2017-09-08 ENCOUNTER — Ambulatory Visit (INDEPENDENT_AMBULATORY_CARE_PROVIDER_SITE_OTHER): Payer: 59 | Admitting: Orthopaedic Surgery

## 2017-09-08 ENCOUNTER — Telehealth (INDEPENDENT_AMBULATORY_CARE_PROVIDER_SITE_OTHER): Payer: Self-pay | Admitting: Orthopaedic Surgery

## 2017-09-08 ENCOUNTER — Encounter (INDEPENDENT_AMBULATORY_CARE_PROVIDER_SITE_OTHER): Payer: Self-pay | Admitting: Orthopaedic Surgery

## 2017-09-08 DIAGNOSIS — M67922 Unspecified disorder of synovium and tendon, left upper arm: Secondary | ICD-10-CM

## 2017-09-08 NOTE — Progress Notes (Signed)
Office Visit Note   Patient: John Esparza           Date of Birth: 1966-02-16           MRN: [id number] Visit Date: 09/08/2017              Requested by: Sherren Mocha, MD [address] [place], [place] [address] PCP: Sherren Mocha, MD   Assessment & Plan: Visit Diagnoses:  1. Tendinopathy of left biceps     Plan: Impression is left distal biceps tendinitis.  At this point we have counseled John Esparza on the need for a rest.  We are also sending him to outpatient physical therapy to work on strengthening as well as modalities to include iontophoresis.  I am keeping him out of work for 2-3 days as he works for the [employer] and has a very active job.  He will follow-up with [place] in 6 weeks time for repeat evaluation at that time if he is still having pain we may entertain an MRI to assess his biceps.  Follow-Up Instructions: Return in about 6 weeks (around 10/20/2017).   Orders:  No orders of the defined types were placed in this encounter.  No orders of the defined types were placed in this encounter.     Procedures: No procedures performed   Clinical Data: No additional findings.   Subjective: Chief Complaint  Patient presents with  . Left Elbow - Pain    HPI John Esparza is a pleasant 52 year old gentleman new patient who presents our clinic today with left forearm pain.  This began back in September 2018 when he was training for John Esparza race doing a lot of pull-ups.  All his pain is over the distal biceps.  It is progressively worsened.  He describes this as a constant pain worse with supination and pronation of the forearm.  He has tried ice as well as a prednisone 2 different times given him by his primary care provider.  This helped a little but did not last very long.  No numbness tingling burning.  No previous history of left upper extremity pain.  Review of Systems as detailed in HPI.  All others reviewed and are negative.   Objective: Vital Signs: There were no vitals  taken for this visit.  Physical Exam well-developed well-nourished gentleman in no acute distress.  Alert and oriented x3. Ortho Exam examination of his left upper extremity reveals no deformity.  Marked tenderness at the distal biceps.  Full elbow strength.  Marked pain with supination and pronation of the elbow.  He is resting intact distally.  Specialty Comments:  No specialty comments available.  Imaging: Previous x-rays of his elbow reviewed by me are negative for fracture or calcification.   PMFS History: Patient Active Problem List   Diagnosis Date Noted  . Psoriatic arthritis (HCC) 04/20/2017  . Family history of lupus erythematosus 09/04/2016  . Psoriasis 12/29/2015  . CAD, NATIVE VESSEL 09/21/2009  . Essential hypertension 09/10/2009  . MYOCARDIAL INFARCTION, ACUTE, SUBENDOCARDIAL 09/10/2009  . CHEST PAIN 09/10/2009  . NEPHROLITHIASIS, HX OF 09/10/2009   Past Medical History:  Diagnosis Date  . Anxiety   . Back problem   . HTN (hypertension)   . Hyperlipidemia   . NSTEMI (non-ST elevated myocardial infarction) (HCC) 1.1.2011   with two overlapping drug eluting stents mid lad  . Psoriasis     Family History  Problem Relation Age of Onset  . Hypertension Mother  with copd  . COPD Mother   . Heart disease Mother        AMI  . Heart attack Father   . Hypertension Father        with copd  . COPD Father   . Heart attack Brother        three brothers  . Heart disease Brother   . Lupus Sister        twin  . Cancer Sister        cervical cancer  . Coronary artery disease Brother        died following procedure  . Heart disease Brother   . Heart disease Brother     Past Surgical History:  Procedure Laterality Date  . APPENDECTOMY    . disk repla     L5-S1  . RETINAL DETACHMENT SURGERY  age 52   traumatic   Social History   Occupational History  . Not on file  Tobacco Use  . Smoking status: Never Smoker  . Smokeless tobacco: Never Used    Substance and Sexual Activity  . Alcohol use: Yes    Alcohol/week: 0.0 oz    Comment: drinks 12 packs of beer a week  . Drug use: No  . Sexual activity: No

## 2017-09-08 NOTE — Telephone Encounter (Signed)
Referral sent to [place] location [address]. Called patient, patient aware.

## 2017-09-08 NOTE — Telephone Encounter (Signed)
Patient needs referral put in for PT. Would like a call once sent, # [phone]

## 2017-10-02 DIAGNOSIS — L409 Psoriasis, unspecified: Secondary | ICD-10-CM | POA: Diagnosis not present

## 2017-10-02 DIAGNOSIS — M79643 Pain in unspecified hand: Secondary | ICD-10-CM | POA: Diagnosis not present

## 2017-10-02 DIAGNOSIS — L4050 Arthropathic psoriasis, unspecified: Secondary | ICD-10-CM | POA: Diagnosis not present

## 2017-10-03 ENCOUNTER — Other Ambulatory Visit: Payer: Self-pay | Admitting: Family Medicine

## 2017-10-13 DIAGNOSIS — M25519 Pain in unspecified shoulder: Secondary | ICD-10-CM | POA: Diagnosis not present

## 2017-11-05 ENCOUNTER — Other Ambulatory Visit: Payer: Self-pay | Admitting: Family Medicine

## 2017-11-05 NOTE — Telephone Encounter (Signed)
LOV and labs on 06/22/17. Current prescription still states that the pt needs labs for further refills. Does the pt need to return for additional labs or can medication be refilled until follow up?

## 2017-11-16 DIAGNOSIS — L4050 Arthropathic psoriasis, unspecified: Secondary | ICD-10-CM | POA: Diagnosis not present

## 2017-11-30 ENCOUNTER — Ambulatory Visit: Payer: 59 | Admitting: Physician Assistant

## 2017-11-30 ENCOUNTER — Encounter: Payer: Self-pay | Admitting: Physician Assistant

## 2017-11-30 ENCOUNTER — Other Ambulatory Visit: Payer: Self-pay

## 2017-11-30 VITALS — BP 134/90 | HR 76 | Temp 98.7°F | Resp 16 | Ht 71.0 in | Wt 255.0 lb

## 2017-11-30 DIAGNOSIS — J309 Allergic rhinitis, unspecified: Secondary | ICD-10-CM

## 2017-11-30 MED ORDER — GUAIFENESIN ER 1200 MG PO TB12: 1.0000 | ORAL_TABLET | Freq: Two times a day (BID) | ORAL | 1 refills | Status: DC | PRN

## 2017-11-30 MED ORDER — BENZONATATE 100 MG PO CAPS: 100.0000 mg | ORAL_CAPSULE | Freq: Three times a day (TID) | ORAL | 0 refills | Status: DC | PRN

## 2017-11-30 MED ORDER — AZELASTINE HCL 0.1 % NA SOLN: 2.0000 | Freq: Two times a day (BID) | NASAL | 0 refills | Status: DC

## 2017-11-30 MED ORDER — CETIRIZINE HCL 10 MG PO TABS
10.0000 mg | ORAL_TABLET | Freq: Every day | ORAL | 0 refills | Status: DC
Start: 2017-11-30 — End: 2018-06-19

## 2017-11-30 MED ORDER — FLUTICASONE PROPIONATE 50 MCG/ACT NA SUSP: 2.0000 | Freq: Every day | NASAL | 12 refills | Status: DC

## 2017-11-30 NOTE — Patient Instructions (Addendum)
Once your symptoms are under control, you can stop the cetirizine and the azelastine nasal spray. Continue the Flonase throughout the allergy season, and resume the cetirizine and/or azelastine as needed.    IF you received an x-ray today, you will receive an invoice from [facility]. Please contact [facility] at [phone] with questions or concerns regarding your invoice.   IF you received labwork today, you will receive an invoice from [facility]. Please contact [facility] at [phone] with questions or concerns regarding your invoice.   Our billing staff will not be able to assist you with questions regarding bills from these companies.  You will be contacted with the lab results as soon as they are available. The fastest way to get your results is to activate your My Chart account. Instructions are located on the last page of this paperwork. If you have not heard from us regarding the results in 2 weeks, please contact this office.

## 2017-11-30 NOTE — Progress Notes (Signed)
Subjective:    [name], male    DOB: 05/08/1966, 52 y.o.   MRN: [id number] Chief Complaint  Patient presents with  . URI    sinus drainage, chest congestion, headache, scratchy throat since Thursday     HPI  52 yo male presents for evaluation of cold symptoms for 5 days. Woke up Friday morning (4 days ago) with chest congestion, sinus drainage, headaches (frontal, intermittent), runny nose, cough (dry), itchy throat.   He has tried some mucinex and xyzal without significant relief.   Denies fevers, chills, nausea, vomiting, myalgias. Denies flu vaccine this year. Denies exposure to others who are sick.  Review of Systems  Constitutional: Positive for diaphoresis. Negative for chills, fatigue and fever.  HENT: Positive for congestion, postnasal drip, rhinorrhea and sore throat. Negative for ear pain, sinus pressure, sinus pain, sneezing, tinnitus, trouble swallowing and voice change.   Eyes: Negative for photophobia, pain, discharge, redness, itching and visual disturbance.  Respiratory: Positive for cough. Negative for choking, chest tightness, shortness of breath and wheezing.   Cardiovascular: Negative for chest pain, palpitations and leg swelling.  Gastrointestinal: Negative for diarrhea, nausea and vomiting.  Endocrine: Negative.   Genitourinary: Negative.   Musculoskeletal: Negative for back pain, myalgias, neck pain and neck stiffness.  Skin: Negative.   Allergic/Immunologic: Positive for environmental allergies.  Neurological: Negative.   Hematological: Negative.   Psychiatric/Behavioral: Negative.     Patient Active Problem List   Diagnosis Date Noted  . Psoriatic arthritis (HCC) 04/20/2017  . Family history of lupus erythematosus 09/04/2016  . Psoriasis 12/29/2015  . CAD, NATIVE VESSEL 09/21/2009  . Essential hypertension 09/10/2009  . MYOCARDIAL INFARCTION, ACUTE, SUBENDOCARDIAL 09/10/2009  . CHEST PAIN 09/10/2009  . NEPHROLITHIASIS, HX OF  09/10/2009    Past Medical History:  Diagnosis Date  . Anxiety   . Back problem   . HTN (hypertension)   . Hyperlipidemia   . NSTEMI (non-ST elevated myocardial infarction) (HCC) 1.1.2011   with two overlapping drug eluting stents mid lad  . Psoriasis     Prior to Admission medications   Medication Sig Start Date End Date Taking? Authorizing Provider  aspirin 325 MG EC tablet Take 325 mg by mouth daily.   Yes [provider]  atenolol (TENORMIN) 50 MG tablet TAKE 1 TABLET BY MOUTH TWICE DAILY 10/05/17  Yes Sherren MochaShaw, Eva N, MD  atorvastatin (LIPITOR) 20 MG tablet TAKE 1 TABLET BY MOUTH ONCE DAILY AT 6 PM NEED CHOLESTEROL AND LIVER LABS CHECKED BEFORE FURTHER REFILLS ARE GIVEN 11/06/17  Yes Sherren MochaShaw, Eva N, MD  clobetasol ointment (TEMOVATE) 0.05 % Apply 1 application topically 2 (two) times daily. 11/15/16  Yes Sherren MochaShaw, Eva N, MD  predniSONE (DELTASONE) 20 MG tablet Take 3 tabs qd x 3d, then 2 tabs qd x 3d then 1 tab qd x 3d, then 1/2 tab xd x 4d 09/05/17  Yes Sherren MochaShaw, Eva N, MD  folic acid (FOLVITE) 1 MG tablet  08/30/17   [provider]  methotrexate 2.5 MG tablet  08/10/17   [provider]  nitroGLYCERIN (NITROSTAT) 0.4 MG SL tablet DISSOLVE ONE TABLET UNDER THE TONGUE EVERY 5 MINUTES AS NEEDED FOR CHEST PAIN.  DO NOT EXCEED A TOTAL OF 3 DOSES IN 15 MINUTES Patient not taking: Reported on 11/30/2017 03/11/15   Ethelda ChickSmith, Kristi M, MD  oxyCODONE-acetaminophen (PERCOCET/ROXICET) 5-325 MG tablet Take 1 tablet by mouth every 6 (six) hours as needed for severe pain. Patient not taking: Reported on 11/30/2017  09/05/17   Sherren Mocha, MD    Allergies  Allergen Reactions  . Demerol [Meperidine]     Irregular heartbeat   . Morphine And Related Itching       Objective:   Physical Exam  Constitutional: He is oriented to person, place, and time. He appears well-developed and well-nourished. No distress.  BP 134/90   Pulse 76   Temp 98.7 F (37.1 C)   Resp 16   Ht 5\' 11"  (1.803 m)    Wt 255 lb (115.7 kg)   SpO2 98%   BMI 35.57 kg/m    HENT:  Head: Normocephalic and atraumatic.  Right Ear: External ear normal. No drainage. Tympanic membrane is not injected and not erythematous. No middle ear effusion.  Left Ear: External ear normal. No drainage. Tympanic membrane is not injected and not erythematous.  No middle ear effusion.  Nose: Mucosal edema and rhinorrhea present. No sinus tenderness. Right sinus exhibits no maxillary sinus tenderness and no frontal sinus tenderness. Left sinus exhibits no maxillary sinus tenderness and no frontal sinus tenderness.  Mouth/Throat: Mucous membranes are not pale and not dry. Posterior oropharyngeal erythema present. No oropharyngeal exudate.  Eyes: Pupils are equal, round, and reactive to light. Conjunctivae and EOM are normal. Right eye exhibits no discharge. Left eye exhibits no discharge.  Neck: Normal range of motion. Neck supple.  Cardiovascular: Normal rate, regular rhythm, normal heart sounds and intact distal pulses. Exam reveals no gallop and no friction rub.  No murmur heard. Pulses:      Radial pulses are 2+ on the right side, and 2+ on the left side.       Dorsalis pedis pulses are 2+ on the right side, and 2+ on the left side.       Posterior tibial pulses are 2+ on the right side, and 2+ on the left side.  Pulmonary/Chest: Effort normal and breath sounds normal. No respiratory distress. He has no wheezes. He has no rales.  Musculoskeletal: Normal range of motion. He exhibits no edema.  Lymphadenopathy:    He has no cervical adenopathy.  Neurological: He is alert and oriented to person, place, and time. He has normal reflexes.  Skin: Skin is warm and dry. He is not diaphoretic.  Psychiatric: He has a normal mood and affect. His behavior is normal.          Assessment & Plan:.  1. Allergic rhinitis, unspecified seasonality, unspecified trigger Informed to take azelastine, tessalon and mucinex until his cold  symptoms resolve. Continue flonase and zyrtec throughout allergy season as well as during cold symptoms. Informed to follow up if symptoms worsen or he develops fevers, chills, productive cough associated with fevers/chills, etc.  - azelastine (ASTELIN) 0.1 % nasal spray; Place 2 sprays into both nostrils 2 (two) times daily. Use in each nostril as directed  Dispense: 30 mL; Refill: 0 - fluticasone (FLONASE) 50 MCG/ACT nasal spray; Place 2 sprays into both nostrils daily.  Dispense: 16 g; Refill: 12 - benzonatate (TESSALON) 100 MG capsule; Take 1-2 capsules (100-200 mg total) by mouth 3 (three) times daily as needed for cough.  Dispense: 40 capsule; Refill: 0 - cetirizine (ZYRTEC) 10 MG tablet; Take 1 tablet (10 mg total) by mouth daily.  Dispense: 30 tablet; Refill: 0 - Guaifenesin (MUCINEX MAXIMUM STRENGTH) 1200 MG TB12; Take 1 tablet (1,200 mg total) by mouth every 12 (twelve) hours as needed.  Dispense: 14 tablet; Refill: 1  Return if symptoms worsen or fail to  improve.

## 2017-11-30 NOTE — Progress Notes (Signed)
[name], male    DOB: [birth year], 52 y.o.   MRN: [id number]  PCP: Sherren Mocha, MD  Chief Complaint  Patient presents with  . URI    sinus drainage, chest congestion, headache, scratchy throat since Thursday     Subjective:   Presents for evaluation of upper respiratory symptoms present x5 days.  Awoke on the morning of onset with chest congestion, intermittent headache, sinus pressure and drainage, rhinorrhea, nonproductive cough and sore throat.  OTC guaifenesin and Xyzal without significant benefit. No associated fever, chills, GI symptoms, body aches. Did not receive seasonal influenza vaccine. No known exposure to sick contacts.    Review of Systems Constitutional: Positive for diaphoresis. Negative for chills, fatigue and fever.  HENT: Positive for congestion, postnasal drip, rhinorrhea and sore throat. Negative for ear pain, sinus pressure, sinus pain, sneezing, tinnitus, trouble swallowing and voice change.   Eyes: Negative for photophobia, pain, discharge, redness, itching and visual disturbance.  Respiratory: Positive for cough. Negative for choking, chest tightness, shortness of breath and wheezing.   Cardiovascular: Negative for chest pain, palpitations and leg swelling.  Gastrointestinal: Negative for diarrhea, nausea and vomiting.  Endocrine: Negative.   Genitourinary: Negative.   Musculoskeletal: Negative for back pain, myalgias, neck pain and neck stiffness.  Skin: Negative.   Allergic/Immunologic: Positive for environmental allergies.  Neurological: Negative.   Hematological: Negative.   Psychiatric/Behavioral: Negative.        Patient Active Problem List   Diagnosis Date Noted  . Psoriatic arthritis (HCC) 04/20/2017  . Family history of lupus erythematosus 09/04/2016  . Psoriasis 12/29/2015  . CAD, NATIVE VESSEL 09/21/2009  . Essential hypertension 09/10/2009  . MYOCARDIAL INFARCTION, ACUTE, SUBENDOCARDIAL 09/10/2009  . CHEST  PAIN 09/10/2009  . NEPHROLITHIASIS, HX OF 09/10/2009     Prior to Admission medications   Medication Sig Start Date End Date Taking? Authorizing Provider  aspirin 325 MG EC tablet Take 325 mg by mouth daily.   Yes [provider]  atenolol (TENORMIN) 50 MG tablet TAKE 1 TABLET BY MOUTH TWICE DAILY 10/05/17  Yes Sherren Mocha, MD  atorvastatin (LIPITOR) 20 MG tablet TAKE 1 TABLET BY MOUTH ONCE DAILY AT 6 PM NEED CHOLESTEROL AND LIVER LABS CHECKED BEFORE FURTHER REFILLS ARE GIVEN 11/06/17  Yes Sherren Mocha, MD  clobetasol ointment (TEMOVATE) 0.05 % Apply 1 application topically 2 (two) times daily. 11/15/16  Yes Sherren Mocha, MD  predniSONE (DELTASONE) 20 MG tablet Take 3 tabs qd x 3d, then 2 tabs qd x 3d then 1 tab qd x 3d, then 1/2 tab xd x 4d 09/05/17  Yes Sherren Mocha, MD  folic acid (FOLVITE) 1 MG tablet  08/30/17   [provider]  methotrexate 2.5 MG tablet  08/10/17   [provider]  nitroGLYCERIN (NITROSTAT) 0.4 MG SL tablet DISSOLVE ONE TABLET UNDER THE TONGUE EVERY 5 MINUTES AS NEEDED FOR CHEST PAIN.  DO NOT EXCEED A TOTAL OF 3 DOSES IN 15 MINUTES Patient not taking: Reported on 11/30/2017 03/11/15   Ethelda Chick, MD  oxyCODONE-acetaminophen (PERCOCET/ROXICET) 5-325 MG tablet Take 1 tablet by mouth every 6 (six) hours as needed for severe pain. Patient not taking: Reported on 11/30/2017 09/05/17   Sherren Mocha, MD     Allergies  Allergen Reactions  . Demerol [Meperidine]     Irregular heartbeat   . Morphine And Related Itching       Objective:  Physical Exam  Constitutional:  He is oriented to person, place, and time. He appears well-developed and well-nourished. He is active and cooperative. No distress.  BP 134/90   Pulse 76   Temp 98.7 F (37.1 C)   Resp 16   Ht 5\' 11"  (1.803 m)   Wt 255 lb (115.7 kg)   SpO2 98%   BMI 35.57 kg/m   HENT:  Head: Normocephalic and atraumatic.  Right Ear: Hearing, tympanic membrane, external ear and ear canal normal.    Left Ear: Hearing, tympanic membrane, external ear and ear canal normal.  Nose: Mucosal edema and rhinorrhea present. No nose lacerations, sinus tenderness, nasal deformity, septal deviation or nasal septal hematoma. No epistaxis.  No foreign bodies.  Mouth/Throat: Uvula is midline and mucous membranes are normal. No oral lesions. No uvula swelling. Posterior oropharyngeal erythema present. No oropharyngeal exudate, posterior oropharyngeal edema or tonsillar abscesses.  Eyes: Conjunctivae are normal. No scleral icterus.  Neck: Normal range of motion and phonation normal. Neck supple. No thyromegaly present.  Cardiovascular: Normal rate, regular rhythm and normal heart sounds.  Pulses:      Radial pulses are 2+ on the right side, and 2+ on the left side.  Pulmonary/Chest: Effort normal and breath sounds normal.  Lymphadenopathy:       Head (right side): No tonsillar, no preauricular, no posterior auricular and no occipital adenopathy present.       Head (left side): No tonsillar, no preauricular, no posterior auricular and no occipital adenopathy present.    He has no cervical adenopathy.       Right: No supraclavicular adenopathy present.       Left: No supraclavicular adenopathy present.  Neurological: He is alert and oriented to person, place, and time. No sensory deficit.  Skin: Skin is warm, dry and intact. No rash noted. No cyanosis or erythema. Nails show no clubbing.  Psychiatric: He has a normal mood and affect. His speech is normal and behavior is normal.           Assessment & Plan:   1. Allergic rhinitis, unspecified seasonality, unspecified trigger Supportive care.  Anticipatory guidance.  RTC if symptoms worsen/persist. - azelastine (ASTELIN) 0.1 % nasal spray; Place 2 sprays into both nostrils 2 (two) times daily. Use in each nostril as directed  Dispense: 30 mL; Refill: 0 - fluticasone (FLONASE) 50 MCG/ACT nasal spray; Place 2 sprays into both nostrils daily.  Dispense:  16 g; Refill: 12 - benzonatate (TESSALON) 100 MG capsule; Take 1-2 capsules (100-200 mg total) by mouth 3 (three) times daily as needed for cough.  Dispense: 40 capsule; Refill: 0 - cetirizine (ZYRTEC) 10 MG tablet; Take 1 tablet (10 mg total) by mouth daily.  Dispense: 30 tablet; Refill: 0 - Guaifenesin (MUCINEX MAXIMUM STRENGTH) 1200 MG TB12; Take 1 tablet (1,200 mg total) by mouth every 12 (twelve) hours as needed.  Dispense: 14 tablet; Refill: 1    Return if symptoms worsen or fail to improve.   Fernande Brashelle S. Coryn Mosso, PA-C [facility] at [facility]

## 2017-12-14 DIAGNOSIS — L4050 Arthropathic psoriasis, unspecified: Secondary | ICD-10-CM | POA: Diagnosis not present

## 2017-12-14 DIAGNOSIS — L409 Psoriasis, unspecified: Secondary | ICD-10-CM | POA: Diagnosis not present

## 2017-12-14 DIAGNOSIS — M25529 Pain in unspecified elbow: Secondary | ICD-10-CM | POA: Diagnosis not present

## 2018-01-12 ENCOUNTER — Other Ambulatory Visit: Payer: Self-pay | Admitting: Family Medicine

## 2018-01-27 DIAGNOSIS — Z79899 Other long term (current) drug therapy: Secondary | ICD-10-CM | POA: Diagnosis not present

## 2018-01-27 DIAGNOSIS — L409 Psoriasis, unspecified: Secondary | ICD-10-CM | POA: Diagnosis not present

## 2018-01-27 DIAGNOSIS — L4050 Arthropathic psoriasis, unspecified: Secondary | ICD-10-CM | POA: Diagnosis not present

## 2018-02-08 DIAGNOSIS — L4050 Arthropathic psoriasis, unspecified: Secondary | ICD-10-CM | POA: Diagnosis not present

## 2018-02-15 ENCOUNTER — Other Ambulatory Visit: Payer: Self-pay | Admitting: Family Medicine

## 2018-02-16 NOTE — Telephone Encounter (Signed)
Atenolol refill Last Refill:01/12/18 # 60 Last OV: 06/22/17 PCP: Dr Clelia CroftShaw  Pharmacy:[facility] [phone] [address]  Called pt and appt made for 02/27/18

## 2018-02-22 DIAGNOSIS — L4050 Arthropathic psoriasis, unspecified: Secondary | ICD-10-CM | POA: Diagnosis not present

## 2018-02-27 ENCOUNTER — Ambulatory Visit: Payer: 59 | Admitting: Family Medicine

## 2018-02-27 ENCOUNTER — Other Ambulatory Visit: Payer: Self-pay

## 2018-02-27 ENCOUNTER — Encounter: Payer: Self-pay | Admitting: Family Medicine

## 2018-02-27 VITALS — BP 130/82 | HR 60 | Temp 98.8°F | Resp 20 | Ht 72.21 in | Wt 254.6 lb

## 2018-02-27 DIAGNOSIS — M25511 Pain in right shoulder: Secondary | ICD-10-CM | POA: Diagnosis not present

## 2018-02-27 DIAGNOSIS — I10 Essential (primary) hypertension: Secondary | ICD-10-CM

## 2018-02-27 DIAGNOSIS — L4050 Arthropathic psoriasis, unspecified: Secondary | ICD-10-CM | POA: Diagnosis not present

## 2018-02-27 DIAGNOSIS — M25551 Pain in right hip: Secondary | ICD-10-CM

## 2018-02-27 DIAGNOSIS — M25552 Pain in left hip: Secondary | ICD-10-CM

## 2018-02-27 DIAGNOSIS — M25512 Pain in left shoulder: Secondary | ICD-10-CM

## 2018-02-27 DIAGNOSIS — I25119 Atherosclerotic heart disease of native coronary artery with unspecified angina pectoris: Secondary | ICD-10-CM

## 2018-02-27 MED ORDER — ATENOLOL 50 MG PO TABS: ORAL_TABLET | ORAL | 0 refills | Status: DC

## 2018-02-27 MED ORDER — NORTRIPTYLINE HCL 10 MG PO CAPS: 10.0000 mg | ORAL_CAPSULE | Freq: Every day | ORAL | 2 refills | Status: DC

## 2018-02-27 MED ORDER — ATORVASTATIN CALCIUM 20 MG PO TABS: ORAL_TABLET | ORAL | 0 refills | Status: DC

## 2018-02-27 MED ORDER — CELECOXIB 200 MG PO CAPS: 200.0000 mg | ORAL_CAPSULE | Freq: Two times a day (BID) | ORAL | 0 refills | Status: DC

## 2018-02-27 MED ORDER — ALPRAZOLAM 0.5 MG PO TABS: 0.5000 mg | ORAL_TABLET | Freq: Every evening | ORAL | 1 refills | Status: DC | PRN

## 2018-02-27 NOTE — Patient Instructions (Addendum)
   IF you received an x-ray today, you will receive an invoice from [facility]. Please contact [facility] at [phone] with questions or concerns regarding your invoice.   IF you received labwork today, you will receive an invoice from [facility]. Please contact [facility] at [phone] with questions or concerns regarding your invoice.   Our billing staff will not be able to assist you with questions regarding bills from these companies.  You will be contacted with the lab results as soon as they are available. The fastest way to get your results is to activate your My Chart account. Instructions are located on the last page of this paperwork. If you have not heard from us regarding the results in 2 weeks, please contact this office.       Chronic Pain, Adult Chronic pain is a type of pain that lasts or keeps coming back (recurs) for at least six months. You may have chronic headaches, abdominal pain, or body pain. Chronic pain may be related to an illness, such as fibromyalgia or complex regional pain syndrome. Sometimes the cause of chronic pain is not known. Chronic pain can make it hard for you to do daily activities. If not treated, chronic pain can lead to other health problems, including anxiety and depression. Treatment depends on the cause and severity of your pain. You may need to work with a pain specialist to come up with a treatment plan. The plan may include medicine, counseling, and physical therapy. Many people benefit from a combination of two or more types of treatment to control their pain. Follow these instructions at home: Lifestyle  Consider keeping a pain diary to share with your health care providers.  Consider talking with a mental health care provider (psychologist) about how to cope with chronic pain.  Consider joining a chronic pain support group.  Try to control or lower your stress levels. Talk to your health care provider about  strategies to do this. General instructions   Take over-the-counter and prescription medicines only as told by your health care provider.  Follow your treatment plan as told by your health care provider. This may include: ? Gentle, regular exercise. ? Eating a healthy diet that includes foods such as vegetables, fruits, fish, and lean meats. ? Cognitive or behavioral therapy. ? Working with a physical therapist. ? Meditation or yoga. ? Acupuncture or massage therapy. ? Aroma, color, light, or sound therapy. ? Local electrical stimulation. ? Shots (injections) of numbing or pain-relieving medicines into the spine or the area of pain.  Check your pain level as told by your health care provider. Ask your health care provider if you should use a pain scale.  Learn as much as you can about how to manage your chronic pain. Ask your health care provider if an intensive pain rehabilitation program or a chronic pain specialist would be helpful.  Keep all follow-up visits as told by your health care provider. This is important. Contact a health care provider if:  Your pain gets worse.  You have new pain.  You have trouble sleeping.  You have trouble doing your normal activities.  Your pain is not controlled with treatment.  Your have side effects from pain medicine.  You feel weak. Get help right away if:  You lose feeling or have numbness in your body.  You lose control of bowel or bladder function.  Your pain suddenly gets much worse.  You develop shaking or chills.  You develop confusion.    You develop chest pain.  You have trouble breathing or shortness of breath.  You pass out.  You have thoughts about hurting yourself or others. This information is not intended to replace advice given to you by your health care provider. Make sure you discuss any questions you have with your health care provider. Document Released: 04/19/2002 Document Revised: 03/27/2016 Document  Reviewed: 01/15/2016 Elsevier Interactive Patient Education  2018 [employer]  

## 2018-02-27 NOTE — Progress Notes (Signed)
Subjective:    [name], male    DOB: [birth year], 52 y.o.   MRN: [id number] Chief Complaint  Patient presents with  . Hypertension    f/u  . Medication Refill    tenormin and lipitor    HPI Did IV infusion but had an allergic reaction  No started cimzia Then the meto - gave him flu-like  Can't hardly move at all right now - gave him half a dose of the infusion which helped for a could mos - steroid shot helped 24 hrs - then they did a prednisone taper that was high dose and so fasing out and gets  Some releif but now it didn't knock it out even though he has been on if 2 weeks and taper is getting down and pain I sso severe. Is working but just powering though.  Gave him some 15-20 pills of oxycodone to sleep and they ave to account for all of his use even off of it work.  2 shots in the stomach No he has to take 2 aleve at night to get to sleep..  60% of the time band night. Has severe leg pain at night so he feels like might have FM Now has plantar fasciitis Doing another spartan race with his nephews which he pushes himself today.  Can't do anything else Daughter is with lupus - never found anything that works for her.  At times there are times he doesn't think he can get hup Has tried all of the joint supplements  Is taking aleve 2 tabs 4-5 pills at nighttime.  Both shoulders are ttp, Lt>Rt hp - elbows do ok but wake up tender  Past Medical History:  Diagnosis Date  . Anxiety   . Back problem   . HTN (hypertension)   . Hyperlipidemia   . MYOCARDIAL INFARCTION, ACUTE, SUBENDOCARDIAL 09/10/2009   Qualifier: Diagnosis of  By: Denyse Amass CMA, Carol    . NSTEMI (non-ST elevated myocardial infarction) (HCC) 1.1.2011   with two overlapping drug eluting stents mid lad  . Psoriasis    Past Surgical History:  Procedure Laterality Date  . APPENDECTOMY    . disk repla     L5-S1  . RETINAL DETACHMENT SURGERY  age 53   traumatic   Current Outpatient Medications on  File Prior to Visit  Medication Sig Dispense Refill  . aspirin 325 MG EC tablet Take 325 mg by mouth daily.    Marland Kitchen atenolol (TENORMIN) 50 MG tablet TAKE 1 TABLET BY MOUTH TWICE DAILY. NEED OFFICE VISIT FOR MORE REFILLS. 60 tablet 0  . atorvastatin (LIPITOR) 20 MG tablet TAKE 1 TABLET BY MOUTH ONCE DAILY AT 6 PM NEED CHOLESTEROL AND LIVER LABS CHECKED BEFORE FURTHER REFILLS ARE GIVEN 15 tablet 0  . clobetasol ointment (TEMOVATE) 0.05 % Apply 1 application topically 2 (two) times daily. 60 g 3  . fluticasone (FLONASE) 50 MCG/ACT nasal spray Place 2 sprays into both nostrils daily. 16 g 12  . azelastine (ASTELIN) 0.1 % nasal spray Place 2 sprays into both nostrils 2 (two) times daily. Use in each nostril as directed (Patient not taking: Reported on 02/27/2018) 30 mL 0  . benzonatate (TESSALON) 100 MG capsule Take 1-2 capsules (100-200 mg total) by mouth 3 (three) times daily as needed for cough. (Patient not taking: Reported on 02/27/2018) 40 capsule 0  . cetirizine (ZYRTEC) 10 MG tablet Take 1 tablet (10 mg total) by mouth daily. (Patient not taking: Reported on 02/27/2018)  30 tablet 0  . Guaifenesin (MUCINEX MAXIMUM STRENGTH) 1200 MG TB12 Take 1 tablet (1,200 mg total) by mouth every 12 (twelve) hours as needed. (Patient not taking: Reported on 02/27/2018) 14 tablet 1  . nitroGLYCERIN (NITROSTAT) 0.4 MG SL tablet DISSOLVE ONE TABLET UNDER THE TONGUE EVERY 5 MINUTES AS NEEDED FOR CHEST PAIN.  DO NOT EXCEED A TOTAL OF 3 DOSES IN 15 MINUTES (Patient not taking: Reported on 11/30/2017) 20 tablet 11   No current facility-administered medications on file prior to visit.    Allergies  Allergen Reactions  . Demerol [Meperidine]     Irregular heartbeat   . Morphine And Related Itching   Family History  Problem Relation Age of Onset  . Hypertension Mother        with copd  . COPD Mother   . Heart disease Mother        AMI  . Heart attack Father   . Hypertension Father        with copd  . COPD Father     . Heart attack Brother        three brothers  . Heart disease Brother   . Lupus Sister        twin  . Cancer Sister        cervical cancer  . Coronary artery disease Brother        died following procedure  . Heart disease Brother   . Heart disease Brother    Social History   Socioeconomic History  . Marital status: Single    Spouse name: Not on file  . Number of children: Not on file  . Years of education: Not on file  . Highest education level: Not on file  Occupational History  . Not on file  Social Needs  . Financial resource strain: Not on file  . Food insecurity:    Worry: Not on file    Inability: Not on file  . Transportation needs:    Medical: Not on file    Non-medical: Not on file  Tobacco Use  . Smoking status: Never Smoker  . Smokeless tobacco: Never Used  Substance and Sexual Activity  . Alcohol use: Yes    Alcohol/week: 0.0 oz    Comment: drinks 12 packs of beer a week  . Drug use: No  . Sexual activity: Never  Lifestyle  . Physical activity:    Days per week: Not on file    Minutes per session: Not on file  . Stress: Not on file  Relationships  . Social connections:    Talks on phone: Not on file    Gets together: Not on file    Attends religious service: Not on file    Active member of club or organization: Not on file    Attends meetings of clubs or organizations: Not on file    Relationship status: Not on file  Other Topics Concern  . Not on file  Social History Narrative   Marital status: single;       Children: 2 daughters; 1 grandson      Lives: with daughter, grandson      Employment:  Emergency planning/management officer for [employer]      Tobacco; none      Alcohol:  Weekends      Exercise:  daily   Depression screen [facility] 2/9 02/27/2018 11/30/2017 09/05/2017 05/28/2017 04/20/2017  Decreased Interest 0 0 0 0 0  Down, Depressed, Hopeless 0 0 0  0 0  PHQ - 2 Score 0 0 0 0 0      Review of Systems See hpi    Objective:    Physical Exam  Constitutional: He is oriented to person, place, and time. He appears well-developed and well-nourished. No distress.  HENT:  Head: Normocephalic and atraumatic.  Eyes: No scleral icterus.  Pulmonary/Chest: Effort normal.  Neurological: He is alert and oriented to person, place, and time.  Skin: Skin is warm and dry. He is not diaphoretic.  Psychiatric: He has a normal mood and affect. His behavior is normal.      BP (!) 146/93 (BP Location: Right Arm, Patient Position: Sitting, Cuff Size: Large)   Pulse 60   Temp 98.8 F (37.1 C) (Oral)   Resp 20   Ht 6' 0.21" (1.834 m)   Wt 254 lb 9.6 oz (115.5 kg)   SpO2 95%   BMI 34.33 kg/m  BP 130/82 Assessment & Plan:   1. Pain of both hip joints   2. Pain of both shoulder joints   3. Psoriatic arthritis (HCC)   4. Essential hypertension   5. Atherosclerosis of native coronary artery of native heart with angina pectoris (HCC)     Orders Placed This Encounter  Procedures  . Ambulatory referral to Orthopedic Surgery    Referral Priority:   Routine    Referral Type:   Surgical    Referral Reason:   Specialty Services Required    Requested Specialty:   Orthopedic Surgery    Number of Visits Requested:   1  . Care order/instruction:    Scheduling Instructions:     Recheck BP    Meds ordered this encounter  Medications  . celecoxib (CELEBREX) 200 MG capsule    Sig: Take 1 capsule (200 mg total) by mouth 2 (two) times daily.    Dispense:  180 capsule    Refill:  0  . nortriptyline (PAMELOR) 10 MG capsule    Sig: Take 1 capsule (10 mg total) by mouth at bedtime. X 1 week, the 2 tab po qhs x 2 weeks, then 3 tab po qhs    Dispense:  90 capsule    Refill:  2  . atenolol (TENORMIN) 50 MG tablet    Sig: TAKE 1 TABLET BY MOUTH TWICE DAILY    Dispense:  180 tablet    Refill:  0    Please consider 90 day supplies to promote better adherence  . atorvastatin (LIPITOR) 20 MG tablet    Sig: TAKE 1 TABLET BY MOUTH ONCE  DAILY AT 6 PM    Dispense:  90 tablet    Refill:  0    Please consider 90 day supplies to promote better adherence  . ALPRAZolam (XANAX) 0.5 MG tablet    Sig: Take 1 tablet (0.5 mg total) by mouth at bedtime as needed for anxiety or sleep.    Dispense:  30 tablet    Refill:  1     [name], M.D.  [facility] at [facility]  [place] [address] [place], [place] [address] [phone] phone [phone] fax  02/27/18 1:53 PM

## 2018-03-08 DIAGNOSIS — L4050 Arthropathic psoriasis, unspecified: Secondary | ICD-10-CM | POA: Diagnosis not present

## 2018-03-10 DIAGNOSIS — R5383 Other fatigue: Secondary | ICD-10-CM | POA: Diagnosis not present

## 2018-03-10 DIAGNOSIS — L4050 Arthropathic psoriasis, unspecified: Secondary | ICD-10-CM | POA: Diagnosis not present

## 2018-03-10 DIAGNOSIS — Z79899 Other long term (current) drug therapy: Secondary | ICD-10-CM | POA: Diagnosis not present

## 2018-03-10 DIAGNOSIS — M25569 Pain in unspecified knee: Secondary | ICD-10-CM | POA: Diagnosis not present

## 2018-03-10 DIAGNOSIS — L409 Psoriasis, unspecified: Secondary | ICD-10-CM | POA: Diagnosis not present

## 2018-03-10 LAB — BASIC METABOLIC PANEL
CREATININE: 1 (ref <=1.3)
GLUCOSE: 104
Potassium: 4.4 (ref 3.4–5.3)
Sodium: 139 (ref 137–147)

## 2018-03-10 LAB — CBC AND DIFFERENTIAL
HEMATOCRIT: 45 (ref 41–53)
Hemoglobin: 15.4 (ref 13.5–17.5)
Platelets: 169 (ref 150–399)
WBC: 10.7

## 2018-03-10 LAB — HEPATIC FUNCTION PANEL
ALK PHOS: 53 (ref 25–125)
AST: 33 (ref 14–40)

## 2018-04-19 DIAGNOSIS — L4050 Arthropathic psoriasis, unspecified: Secondary | ICD-10-CM | POA: Diagnosis not present

## 2018-05-18 DIAGNOSIS — L4050 Arthropathic psoriasis, unspecified: Secondary | ICD-10-CM | POA: Diagnosis not present

## 2018-05-31 ENCOUNTER — Encounter: Payer: 59 | Admitting: Family Medicine

## 2018-06-15 DIAGNOSIS — L4050 Arthropathic psoriasis, unspecified: Secondary | ICD-10-CM | POA: Diagnosis not present

## 2018-06-16 DIAGNOSIS — L409 Psoriasis, unspecified: Secondary | ICD-10-CM | POA: Diagnosis not present

## 2018-06-16 DIAGNOSIS — Z79899 Other long term (current) drug therapy: Secondary | ICD-10-CM | POA: Diagnosis not present

## 2018-06-16 DIAGNOSIS — L4050 Arthropathic psoriasis, unspecified: Secondary | ICD-10-CM | POA: Diagnosis not present

## 2018-06-17 LAB — HEPATIC FUNCTION PANEL: ALT: 48 — AB (ref 10–40)

## 2018-06-19 ENCOUNTER — Encounter: Payer: Self-pay | Admitting: Family Medicine

## 2018-06-19 ENCOUNTER — Other Ambulatory Visit: Payer: Self-pay

## 2018-06-19 ENCOUNTER — Ambulatory Visit (INDEPENDENT_AMBULATORY_CARE_PROVIDER_SITE_OTHER): Payer: 59

## 2018-06-19 ENCOUNTER — Ambulatory Visit: Payer: 59 | Admitting: Family Medicine

## 2018-06-19 ENCOUNTER — Telehealth: Payer: Self-pay | Admitting: Family Medicine

## 2018-06-19 VITALS — BP 128/86 | HR 70 | Temp 99.3°F | Resp 16 | Ht 72.0 in | Wt 249.2 lb

## 2018-06-19 DIAGNOSIS — M5441 Lumbago with sciatica, right side: Secondary | ICD-10-CM

## 2018-06-19 DIAGNOSIS — G8929 Other chronic pain: Secondary | ICD-10-CM

## 2018-06-19 DIAGNOSIS — M545 Low back pain: Secondary | ICD-10-CM | POA: Diagnosis not present

## 2018-06-19 MED ORDER — KETOROLAC TROMETHAMINE 30 MG/ML IJ SOLN
30.0000 mg | Freq: Once | INTRAMUSCULAR | Status: AC
  Administered 2018-06-19: 30 mg via INTRAMUSCULAR

## 2018-06-19 NOTE — Telephone Encounter (Signed)
Contact patient to advise his x-ray shows degenerative disc disease of both the thoracic and lumbar spine. I recommend follow-up with orthopedics for further evaluation and other options for treatment as this will likely be a chronic recurrence of back pain.

## 2018-06-19 NOTE — Telephone Encounter (Signed)
Spoke with pt advised his x-ray shows degenerative disc disease of both the thoraic and lumbar spine.  She recommends f/u with orthopedics for further workup and other options for treatment as this will likely be a chronic reoccurrence of back pain.  Pt agreeable with orthopedic referral -advised we will send referral and ortho office will call him in 5-10 days with appt.

## 2018-06-19 NOTE — Progress Notes (Signed)
[name], male    DOB: [birth year], 52 y.o.   MRN: [id number]  PCP: Sherren Mocha, MD  Chief Complaint  Patient presents with  . Back Pain    lower back, "feels like golf ball" x 1 1/2 months    Subjective:  HPI  John Esparza is a 52 y.o. male presents for evaluation back pain.  HPI   Back Pain: Patient presents for presents evaluation of low back problems.  Symptoms have been present for 1 month, pain localized to the right side, and feels like a persistent knot. Pain is described as aching. History of disc repair in 2005. 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 He suffers from psoriatic arthritis and is followed by rheumatology.  He reports that he is to pick up a new prednisone prescription prescribed by his rheumatologist today which she has not started.  He has not taken anything else for the back pain.  He denies any pain with defecation, urination, or lower extremity weakness.Last imaging of his lumbar spine was completed in 2013 which was significant for some mild degenerative changes. Social History   Socioeconomic History  . Marital status: Single    Spouse name: Not on file  . Number of children: Not on file  . Years of education: Not on file  . Highest education level: Not on file  Occupational History  . Not on file  Social Needs  . Financial resource strain: Not on file  . Food insecurity:    Worry: Not on file    Inability: Not on file  . Transportation needs:    Medical: Not on file    Non-medical: Not on file  Tobacco Use  . Smoking status: Never Smoker  . Smokeless tobacco: Never Used  Substance and Sexual Activity  . Alcohol use: Yes    Alcohol/week: 0.0 standard drinks    Comment: drinks 12 packs of beer a week  . Drug use: No  . Sexual activity: Never  Lifestyle  . Physical activity:    Days per week: Not on file    Minutes per session: Not on file  . Stress: Not on file  Relationships  . Social connections:   Talks on phone: Not on file    Gets together: Not on file    Attends religious service: Not on file    Active member of club or organization: Not on file    Attends meetings of clubs or organizations: Not on file    Relationship status: Not on file  . Intimate partner violence:    Fear of current or ex partner: Not on file    Emotionally abused: Not on file    Physically abused: Not on file    Forced sexual activity: Not on file  Other Topics Concern  . Not on file  Social History Narrative   Marital status: single;       Children: 2 daughters; 1 grandson      Lives: with daughter, grandson      Employment:  Emergency planning/management officer for [employer]      Tobacco; none      Alcohol:  Weekends      Exercise:  daily    Family History  Problem Relation Age of Onset  . Hypertension Mother        with copd  . COPD Mother   . Heart disease Mother        AMI  .  Heart attack Father   . Hypertension Father        with copd  . COPD Father   . Heart attack Brother        three brothers  . Heart disease Brother   . Lupus Sister        twin  . Cancer Sister        cervical cancer  . Coronary artery disease Brother        died following procedure  . Heart disease Brother   . Heart disease Brother    Review of Systems Pertinent negatives listed in HPI Patient Active Problem List   Diagnosis Date Noted  . Psoriatic arthritis (HCC) 04/20/2017  . Family history of lupus erythematosus 09/04/2016  . Psoriasis 12/29/2015  . CAD, NATIVE VESSEL 09/21/2009  . Essential hypertension 09/10/2009  . CHEST PAIN 09/10/2009  . NEPHROLITHIASIS, HX OF 09/10/2009    Allergies  Allergen Reactions  . Demerol [Meperidine]     Irregular heartbeat   . Morphine And Related Itching    Prior to Admission medications   Medication Sig Start Date End Date Taking? Authorizing Provider  ALPRAZolam Prudy Feeler) 0.5 MG tablet Take 1 tablet (0.5 mg total) by mouth at bedtime as needed for  anxiety or sleep. 02/27/18  Yes Sherren Mocha, MD  aspirin 325 MG EC tablet Take 325 mg by mouth daily.   Yes [provider]  atenolol (TENORMIN) 50 MG tablet TAKE 1 TABLET BY MOUTH TWICE DAILY 02/27/18  Yes Sherren Mocha, MD  atorvastatin (LIPITOR) 20 MG tablet TAKE 1 TABLET BY MOUTH ONCE DAILY AT 6 PM 02/27/18  Yes Sherren Mocha, MD  nortriptyline (PAMELOR) 10 MG capsule Take 1 capsule (10 mg total) by mouth at bedtime. X 1 week, the 2 tab po qhs x 2 weeks, then 3 tab po qhs 02/27/18  Yes Sherren Mocha, MD  celecoxib (CELEBREX) 200 MG capsule Take 1 capsule (200 mg total) by mouth 2 (two) times daily. Patient not taking: Reported on 06/19/2018 02/27/18   Sherren Mocha, MD  cetirizine (ZYRTEC) 10 MG tablet Take 1 tablet (10 mg total) by mouth daily. Patient not taking: Reported on 02/27/2018 11/30/17   Porfirio Oar, PA  clobetasol ointment (TEMOVATE) 0.05 % Apply 1 application topically 2 (two) times daily. Patient not taking: Reported on 06/19/2018 11/15/16   Sherren Mocha, MD  fluticasone [facility]) 50 MCG/ACT nasal spray Place 2 sprays into both nostrils daily. Patient not taking: Reported on 06/19/2018 11/30/17   Porfirio Oar, PA   Past Medical, Surgical Family and Social History reviewed and updated.   Objective:   Today's Vitals   06/19/18 0912  BP: 128/86  Pulse: 70  Resp: 16  Temp: 99.3 F (37.4 C)  TempSrc: Oral  SpO2: 97%  Weight: 249 lb 3.2 oz (113 kg)  Height: 6' (1.829 m)    Wt Readings from Last 3 Encounters:  06/19/18 249 lb 3.2 oz (113 kg)  02/27/18 254 lb 9.6 oz (115.5 kg)  11/30/17 255 lb (115.7 kg)   Physical Exam General appearance: alert, well developed, well nourished, cooperative and in no distress Head: Normocephalic, without obvious abnormality, atraumatic Respiratory: Respirations even and unlabored, normal respiratory rate Heart: rate and rhythm normal. No gallop or murmurs noted on exam  Extremities: Lumbar spine-no spinous tenderness, normal gait, normal  range of motion. Skin: Skin color, texture, turgor normal. No rashes seen  Psych: Appropriate mood and affect. Neurologic: Mental status: Alert, oriented  to person, place, and time, thought content appropriate.  No results found for: POCGLU  Lab Results  Component Value Date   HGBA1C 5.8 06/22/2017     Assessment & Plan:  1. Chronic left-sided low back pain with right-sided sciatica -Will administer Toradol shot today in office to reduce inflammation.  Patient is to start a prednisone taper prescribed by his rheumatologist today which I feel will resolve some of the right-sided sciatica patient is experiencing.  Will obtain some updated imaging to rule out any worsening degenerative disease since the last image was obtained 2013.  Dg Lumbar Spine Complete  Result Date: 06/19/2018 CLINICAL DATA:  Low back pain. EXAM: LUMBAR SPINE - COMPLETE 4+ VIEW COMPARISON:  CT abdomen pelvis 02/05/2006 FINDINGS: Normal anatomic alignment. No evidence for acute fracture or dislocation. Multilevel lower thoracic and lumbar spine degenerative disc disease. L5-S1 prostatic disc. SI joints are unremarkable. IMPRESSION: Lower lumbar and thoracic spine degenerative changes. Electronically Signed   By: Annia Belt M.D.   On: 06/19/2018 10:35   - DG Lumbar Spine Complete; Future - ketorolac (TORADOL) 30 MG/ML injection 30 mg  -The patient was given clear instructions to go to ER or return to medical center if symptoms do not improve, worsen or new problems develop. The patient verbalized understanding.   A total of 25  minutes spent, greater than 50 % of this time was spent counseling and coordination of care.     Godfrey Pick. Tiburcio Pea, FNP-C Nurse Practitioner (PRN Staff)  Primary Care at [facility] [address] [place], [place]  [phone]

## 2018-06-19 NOTE — Patient Instructions (Addendum)
   If you have lab work done today you will be contacted with your lab results within the next 2 weeks.  If you have not heard from us then please contact us. The fastest way to get your results is to register for My Chart.   IF you received an x-ray today, you will receive an invoice from [facility]. Please contact [facility] at [phone] with questions or concerns regarding your invoice.   IF you received labwork today, you will receive an invoice from [facility]. Please contact [facility] at [phone] with questions or concerns regarding your invoice.   Our billing staff will not be able to assist you with questions regarding bills from these companies.  You will be contacted with the lab results as soon as they are available. The fastest way to get your results is to activate your My Chart account. Instructions are located on the last page of this paperwork. If you have not heard from us regarding the results in 2 weeks, please contact this office.     Back Pain, Adult Back pain is very common. The pain often gets better over time. The cause of back pain is usually not dangerous. Most people can learn to manage their back pain on their own. Follow these instructions at home: Watch your back pain for any changes. The following actions may help to lessen any pain you are feeling:  Stay active. Start with short walks on flat ground if you can. Try to walk farther each day.  Exercise regularly as told by your doctor. Exercise helps your back heal faster. It also helps avoid future injury by keeping your muscles strong and flexible.  Do not sit, drive, or stand in one place for more than 30 minutes.  Do not stay in bed. Resting more than 1-2 days can slow down your recovery.  Be careful when you bend or lift an object. Use good form when lifting: ? Bend at your knees. ? Keep the object close to your body. ? Do not twist.  Sleep on a firm mattress. Lie on your  side, and bend your knees. If you lie on your back, put a pillow under your knees.  Take medicines only as told by your doctor.  Put ice on the injured area. ? Put ice in a plastic bag. ? Place a towel between your skin and the bag. ? Leave the ice on for 20 minutes, 2-3 times a day for the first 2-3 days. After that, you can switch between ice and heat packs.  Avoid feeling anxious or stressed. Find good ways to deal with stress, such as exercise.  Maintain a healthy weight. Extra weight puts stress on your back.  Contact a doctor if:  You have pain that does not go away with rest or medicine.  You have worsening pain that goes down into your legs or buttocks.  You have pain that does not get better in one week.  You have pain at night.  You lose weight.  You have a fever or chills. Get help right away if:  You cannot control when you poop (bowel movement) or pee (urinate).  Your arms or legs feel weak.  Your arms or legs lose feeling (numbness).  You feel sick to your stomach (nauseous) or throw up (vomit).  You have belly (abdominal) pain.  You feel like you may pass out (faint). This information is not intended to replace advice given to you by your health care   provider. Make sure you discuss any questions you have with your health care provider. Document Released: 01/14/2008 Document Revised: 01/03/2016 Document Reviewed: 11/29/2013 Elsevier Interactive Patient Education  2018 Elsevier Inc.  

## 2018-06-20 NOTE — Addendum Note (Signed)
Addended by: Bing Neighbors on: 06/20/2018 10:42 PM   Modules accepted: Orders

## 2018-06-27 ENCOUNTER — Encounter: Payer: Self-pay | Admitting: Family Medicine

## 2018-06-29 ENCOUNTER — Encounter: Payer: Self-pay | Admitting: Family Medicine

## 2018-07-01 ENCOUNTER — Other Ambulatory Visit: Payer: Self-pay | Admitting: Family Medicine

## 2018-07-02 NOTE — Telephone Encounter (Signed)
Requested Prescriptions  Pending Prescriptions Disp Refills  . atenolol (TENORMIN) 50 MG tablet [Pharmacy Med Name: ATENOLOL 50MG        TAB] 180 tablet 0    Sig: TAKE 1 TABLET BY MOUTH TWICE DAILY     Cardiovascular:  Beta Blockers Passed - 07/01/2018  6:53 PM      Passed - Last BP in normal range    BP Readings from Last 1 Encounters:  06/19/18 128/86         Passed - Last Heart Rate in normal range    Pulse Readings from Last 1 Encounters:  06/19/18 70         Passed - Valid encounter within last 6 months    Recent Outpatient Visits          1 week ago Chronic left-sided low back pain with right-sided sciatica   [facility], Godfrey PickKimberly S, FNP   4 months ago Pain of both hip joints   [facility], Levell JulyEva N, MD   7 months ago Allergic rhinitis, unspecified seasonality, unspecified trigger   [facility], [place]   10 months ago Localized pain of elbow joint   [facility], Levell JulyEva N, MD   1 year ago Psoriatic arthritis [facility](HCC)   [facility] at [facility], Levell JulyEva N, MD      Future Appointments            In 3 weeks Sherren MochaShaw, Eva N, MD [facility]         . atorvastatin (LIPITOR) 20 MG tablet [Pharmacy Med Name: ATORVASTATIN 20MG    TAB] 90 tablet 0    Sig: TAKE 1 TABLET BY MOUTH ONCE DAILY AT 6PM     Cardiovascular:  Antilipid - Statins Failed - 07/01/2018  6:53 PM      Failed - Total Cholesterol in normal range and within 360 days    Cholesterol  Date Value Ref Range Status  03/11/2015 231 (H) 125 - 200 mg/dL Final         Failed - LDL in normal range and within 360 days    LDL Cholesterol  Date Value Ref Range Status  03/11/2015 137 (H) <130 mg/dL Final    Comment:      Total Cholesterol/HDL Ratio:CHD Risk                        Coronary Heart Disease Risk Table                                        Men       Women          1/2 Average Risk              3.4        3.3              Average  Risk              5.0        4.4           2X Average Risk              9.6        7.1           3X Average Risk  23.4       11.0 Use the calculated Patient Ratio above and the CHD Risk table  to determine the patient's CHD Risk. ATP III Classification (LDL):       < 100        mg/dL         Optimal      161 - 129     mg/dL         Near or Above Optimal      130 - 159     mg/dL         Borderline High      160 - 189     mg/dL         High       > 096        mg/dL         Very High     Footnotes:  (1) ** Please note change in unit of measure and reference range(s). **            Failed - HDL in normal range and within 360 days    HDL  Date Value Ref Range Status  03/11/2015 37 (L) >=40 mg/dL Final         Failed - Triglycerides in normal range and within 360 days    Triglycerides  Date Value Ref Range Status  03/11/2015 284 (H) <150 mg/dL Final         Passed - Patient is not pregnant      Passed - Valid encounter within last 12 months    Recent Outpatient Visits          1 week ago Chronic left-sided low back pain with right-sided sciatica   Primary Care at [facility], Godfrey Pick, FNP   4 months ago Pain of both hip joints   [facility] at [facility], Levell July, MD   7 months ago Allergic rhinitis, unspecified seasonality, unspecified trigger   Primary Care at [facility], [place], [place]   10 months ago Localized pain of elbow joint   [facility] at [facility], Levell July, MD   1 year ago Psoriatic arthritis [facility])   [facility] at [facility], Levell July, MD      Future Appointments            In 3 weeks Sherren Mocha, MD Primary Care at [place], [facility]

## 2018-07-05 ENCOUNTER — Other Ambulatory Visit: Payer: Self-pay | Admitting: Family Medicine

## 2018-07-05 NOTE — Telephone Encounter (Signed)
Copied from CRM [id number]. Topic: Quick Communication - Rx Refill/Question >> Jul 05, 2018 10:12 AM Burchel, Abbi R wrote: Medication: atenolol (TENORMIN) 50 MG tablet   atorvastatin (LIPITOR) 20 MG tablet  Pt requesting enough medication to get to his CPE appt on 12/17. Please advise.   [facility] [address] - [place], [place] - [address] [place] [address] Phone: [phone] Fax: [phone]    Pt was advised that RX refills may take up to 3 business days. We ask that you follow-up with your pharmacy.

## 2018-07-06 MED ORDER — ATENOLOL 50 MG PO TABS: 50.0000 mg | ORAL_TABLET | Freq: Two times a day (BID) | ORAL | 0 refills | Status: DC

## 2018-07-06 MED ORDER — ATORVASTATIN CALCIUM 20 MG PO TABS: ORAL_TABLET | ORAL | 0 refills | Status: DC

## 2018-07-06 NOTE — Telephone Encounter (Signed)
Attempted to contact pharmacy to obtain status of prescription for atenolol and atorvastatin request 07/02/18; no answer at pharmacy; unable to leave message.  Requested Prescriptions  Pending Prescriptions Disp Refills  . atenolol (TENORMIN) 50 MG tablet 60 tablet 0    Sig: Take 1 tablet (50 mg total) by mouth 2 (two) times daily.     Cardiovascular:  Beta Blockers Passed - 07/05/2018  2:42 PM      Passed - Last BP in normal range    BP Readings from Last 1 Encounters:  06/19/18 128/86         Passed - Last Heart Rate in normal range    Pulse Readings from Last 1 Encounters:  06/19/18 70         Passed - Valid encounter within last 6 months    Recent Outpatient Visits          2 weeks ago Chronic left-sided low back pain with right-sided sciatica   [facility], Godfrey PickKimberly S, FNP   4 months ago Pain of both hip joints   [facility], Levell JulyEva N, MD   7 months ago Allergic rhinitis, unspecified seasonality, unspecified trigger   [facility], [place], [place]   10 months ago Localized pain of elbow joint   [facility], Levell JulyEva N, MD   1 year ago Psoriatic arthritis [facility](HCC)   [facility], Levell JulyEva N, MD      Future Appointments            In 3 weeks Sherren MochaShaw, Eva N, MD [facility] at [facility], [facility]         . atorvastatin (LIPITOR) 20 MG tablet 30 tablet 0    Sig: TAKE 1 TABLET BY MOUTH ONCE DAILY AT 6PM     Cardiovascular:  Antilipid - Statins Failed - 07/05/2018  2:42 PM      Failed - Total Cholesterol in normal range and within 360 days    Cholesterol  Date Value Ref Range Status  03/11/2015 231 (H) 125 - 200 mg/dL Final         Failed - LDL in normal range and within 360 days    LDL Cholesterol  Date Value Ref Range Status  03/11/2015 137 (H) <130 mg/dL Final    Comment:      Total Cholesterol/HDL Ratio:CHD Risk                        Coronary Heart Disease Risk Table    Men       Women          1/2 Average Risk              3.4        3.3              Average Risk              5.0        4.4           2X Average Risk              9.6        7.1           3X Average Risk             23.4       11.0 Use the calculated Patient Ratio above and the CHD Risk  table  to determine the patient's CHD Risk. ATP III Classification (LDL):       < 100        mg/dL         Optimal      161 - 129     mg/dL         Near or Above Optimal      130 - 159     mg/dL         Borderline High      160 - 189     mg/dL         High       > 096        mg/dL         Very High     Footnotes:  (1) ** Please note change in unit of measure and reference range(s). **            Failed - HDL in normal range and within 360 days    HDL  Date Value Ref Range Status  03/11/2015 37 (L) >=40 mg/dL Final         Failed - Triglycerides in normal range and within 360 days    Triglycerides  Date Value Ref Range Status  03/11/2015 284 (H) <150 mg/dL Final         Passed - Patient is not pregnant      Passed - Valid encounter within last 12 months    Recent Outpatient Visits          2 weeks ago Chronic left-sided low back pain with right-sided sciatica   [facility] Care at [facility], Godfrey Pick, FNP   4 months ago Pain of both hip joints   [facility] at [facility], Levell July, MD   7 months ago Allergic rhinitis, unspecified seasonality, unspecified trigger   Primary Care at [facility], [place], [place]   10 months ago Localized pain of elbow joint   [facility] at [facility], Levell July, MD   1 year ago Psoriatic arthritis [facility])   [facility] Care at [facility], Levell July, MD      Future Appointments            In 3 weeks Sherren Mocha, MD Primary Care at [place], [facility]

## 2018-07-14 DIAGNOSIS — L4050 Arthropathic psoriasis, unspecified: Secondary | ICD-10-CM | POA: Diagnosis not present

## 2018-07-27 ENCOUNTER — Encounter: Payer: 59 | Admitting: Family Medicine

## 2018-10-07 ENCOUNTER — Telehealth: Payer: Self-pay | Admitting: Family Medicine

## 2018-10-07 NOTE — Telephone Encounter (Signed)
Please adv    Copied from CRM [id number]. Topic: Referral - Request for Referral >> Oct 06, 2018  6:22 PM Trula Slade wrote: Has patient seen PCP for this complaint?  YES *If NO, is insurance requiring patient see PCP for this issue before PCP can refer them? Referral for which specialty: Ortho Preferred provider/office: Dr. Sharolyn Douglas Reason for referral:   Lower Back Pain

## 2018-10-12 NOTE — Telephone Encounter (Signed)
Please advise 

## 2018-11-15 ENCOUNTER — Other Ambulatory Visit: Payer: Self-pay

## 2018-11-15 ENCOUNTER — Telehealth (INDEPENDENT_AMBULATORY_CARE_PROVIDER_SITE_OTHER): Payer: 59 | Admitting: Family Medicine

## 2018-11-15 DIAGNOSIS — M25551 Pain in right hip: Secondary | ICD-10-CM

## 2018-11-15 DIAGNOSIS — G8929 Other chronic pain: Secondary | ICD-10-CM

## 2018-11-15 DIAGNOSIS — L4050 Arthropathic psoriasis, unspecified: Secondary | ICD-10-CM

## 2018-11-15 DIAGNOSIS — M25552 Pain in left hip: Secondary | ICD-10-CM

## 2018-11-15 DIAGNOSIS — M545 Low back pain: Secondary | ICD-10-CM

## 2018-11-15 MED ORDER — METHOCARBAMOL 500 MG PO TABS: 500.0000 mg | ORAL_TABLET | Freq: Four times a day (QID) | ORAL | 0 refills | Status: DC | PRN

## 2018-11-15 MED ORDER — PREDNISONE 10 MG PO TABS: ORAL_TABLET | ORAL | 0 refills | Status: AC

## 2018-11-15 NOTE — Progress Notes (Signed)
Virtual Visit via telephone Note  I connected with patient on 11/15/18 at 951am by telephone and verified that I am speaking with the correct person using two identifiers. John Esparza is currently located at home and patient is currently with her during visit. The provider, Myles Lipps, MD is located in their office at time of visit.  I discussed the limitations, risks, security and privacy concerns of performing an evaluation and management service by telephone and the availability of in person appointments. I also discussed with the patient that there may be a patient responsible charge related to this service. The patient expressed understanding and agreed to proceed.   Telephone visit today for back pain  HPI Last OV nov 2019 Has h/o psoriatic arthritis, sees rheum, Dr Deanne Coffer, last OV per chart review in Nov 2019, not currently on treatment Having flare up, currently mostly in hips, having lots of stiffness, usually gets oral steroid taper but rheumatologist is out of the office this week and unable to see colleague Has h/o artificial disc in 2005 MRI in 2013, last xray 2019 - DDD He is very active Having more issues with low back pain Does not radiate down his legs, no changes in bowel or bladder Feels that most of his pain if muscular in nature ?  Fall Risk  11/15/2018 06/19/2018 02/27/2018 11/30/2017 09/05/2017  Falls in the past year? 0 0 No No No  Number falls in past yr: 0 - - - -     Depression screen [facility] 2/9 11/15/2018 06/19/2018 02/27/2018  Decreased Interest 0 0 0  Down, Depressed, Hopeless 0 0 0  PHQ - 2 Score 0 0 0    Allergies  Allergen Reactions  . Demerol [Meperidine]     Irregular heartbeat   . Morphine And Related Itching    Prior to Admission medications   Medication Sig Start Date End Date Taking? Authorizing Provider  aspirin 325 MG EC tablet Take 325 mg by mouth daily.    [provider]  atenolol (TENORMIN) 50 MG tablet Take 1 tablet (50 mg  total) by mouth 2 (two) times daily. 07/06/18   Sherren Mocha, MD  atorvastatin (LIPITOR) 20 MG tablet TAKE 1 TABLET BY MOUTH ONCE DAILY AT [facility] 07/06/18   Sherren Mocha, MD  nortriptyline (PAMELOR) 10 MG capsule Take 1 capsule (10 mg total) by mouth at bedtime. X 1 week, the 2 tab po qhs x 2 weeks, then 3 tab po qhs 02/27/18   Sherren Mocha, MD    Past Medical History:  Diagnosis Date  . Anxiety   . Back problem   . HTN (hypertension)   . Hyperlipidemia   . MYOCARDIAL INFARCTION, ACUTE, SUBENDOCARDIAL 09/10/2009   Qualifier: Diagnosis of  By: Denyse Amass CMA, Carol    . NSTEMI (non-ST elevated myocardial infarction) (HCC) 1.1.2011   with two overlapping drug eluting stents mid lad  . Psoriasis   . Psoriatic arthritis (HCC) 04/20/2017    Past Surgical History:  Procedure Laterality Date  . APPENDECTOMY    . disk repla     L5-S1  . RETINAL DETACHMENT SURGERY  age 53   traumatic    Social History   Tobacco Use  . Smoking status: Never Smoker  . Smokeless tobacco: Never Used  Substance Use Topics  . Alcohol use: Yes    Alcohol/week: 0.0 standard drinks    Comment: drinks 12 packs of beer a week    Family History  Problem  Relation Age of Onset  . Hypertension Mother        with copd  . COPD Mother   . Heart disease Mother        AMI  . Heart attack Father   . Hypertension Father        with copd  . COPD Father   . Heart attack Brother        three brothers  . Heart disease Brother   . Lupus Sister        twin  . Cancer Sister        cervical cancer  . Coronary artery disease Brother        died following procedure  . Heart disease Brother   . Heart disease Brother     ROS Per hpi  Objective  Vitals as reported by the patient: none  There were no vitals filed for this visit.  ASSESSMENT and PLAN  1. Pain of both hip joints 2. Chronic bilateral low back pain without sciatica 3. Psoriatic arthritis (HCC) Patient reports flare up. Not currently on proper  treatment. Encouraged he seek rheum care again. Acute treatment of flare up given. Reviewed meds r/se/b. Advised dc ASA while on pred. Other orders - predniSONE (DELTASONE) 10 MG tablet; Take 3 tablets (30 mg total) by mouth daily with breakfast for 3 days, THEN 2 tablets (20 mg total) daily with breakfast for 3 days, THEN 1 tablet (10 mg total) daily with breakfast for 3 days, THEN 0.5 tablets (5 mg total) daily with breakfast for 4 days. - methocarbamol (ROBAXIN) 500 MG tablet; Take 1 tablet (500 mg total) by mouth every 6 (six) hours as needed for muscle spasms.  FOLLOW-UP: prn   The above assessment and management plan was discussed with the patient. The patient verbalized understanding of and has agreed to the management plan. Patient is aware to call the clinic if symptoms persist or worsen. Patient is aware when to return to the clinic for a follow-up visit. Patient educated on when it is appropriate to go to the emergency department.    I provided 13 minutes of non-face-to-face time during this encounter.  Myles Lipps, MD Primary Care at [facility] [address] [place], [place] [address] Ph.  [phone] Fax [phone]
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
## 2018-11-17 ENCOUNTER — Telehealth: Payer: Self-pay | Admitting: Family Medicine

## 2018-11-17 DIAGNOSIS — M545 Low back pain, unspecified: Secondary | ICD-10-CM

## 2018-11-17 DIAGNOSIS — G8929 Other chronic pain: Secondary | ICD-10-CM

## 2018-11-17 MED ORDER — ATENOLOL 50 MG PO TABS: 50.0000 mg | ORAL_TABLET | Freq: Two times a day (BID) | ORAL | 1 refills | Status: DC

## 2018-11-17 NOTE — Telephone Encounter (Signed)
° ° °  Copied from CRM [id number]. Topic: General - Other >> Nov 17, 2018 11:12 AM Percival Spanish wrote: Pt left a message on the [facility]EC appt line and said he is having some back and would like to discuss getting some therapy and also req a refill on the below med  atenolol (TENORMIN) 50 MG tablet  Call back number [phone]

## 2018-11-23 DIAGNOSIS — M545 Low back pain: Secondary | ICD-10-CM | POA: Diagnosis not present

## 2018-11-24 DIAGNOSIS — M545 Low back pain: Secondary | ICD-10-CM | POA: Diagnosis not present

## 2018-11-30 DIAGNOSIS — M545 Low back pain: Secondary | ICD-10-CM | POA: Diagnosis not present

## 2018-12-02 DIAGNOSIS — M545 Low back pain: Secondary | ICD-10-CM | POA: Diagnosis not present

## 2018-12-07 DIAGNOSIS — M545 Low back pain: Secondary | ICD-10-CM | POA: Diagnosis not present

## 2018-12-09 DIAGNOSIS — M545 Low back pain: Secondary | ICD-10-CM | POA: Diagnosis not present

## 2018-12-14 DIAGNOSIS — M545 Low back pain: Secondary | ICD-10-CM | POA: Diagnosis not present

## 2018-12-16 DIAGNOSIS — M545 Low back pain: Secondary | ICD-10-CM | POA: Diagnosis not present

## 2018-12-21 DIAGNOSIS — M545 Low back pain: Secondary | ICD-10-CM | POA: Diagnosis not present

## 2018-12-23 DIAGNOSIS — M545 Low back pain: Secondary | ICD-10-CM | POA: Diagnosis not present

## 2019-01-27 ENCOUNTER — Telehealth: Payer: Self-pay | Admitting: General Practice

## 2019-01-27 NOTE — Telephone Encounter (Signed)
ERROR

## 2019-04-07 ENCOUNTER — Encounter: Payer: Self-pay | Admitting: Family Medicine

## 2019-04-07 ENCOUNTER — Other Ambulatory Visit: Payer: Self-pay

## 2019-04-07 ENCOUNTER — Ambulatory Visit: Payer: 59 | Admitting: Family Medicine

## 2019-04-07 VITALS — BP 130/78 | HR 65 | Temp 99.0°F | Ht 72.0 in | Wt 255.0 lb

## 2019-04-07 DIAGNOSIS — Z566 Other physical and mental strain related to work: Secondary | ICD-10-CM | POA: Diagnosis not present

## 2019-04-07 DIAGNOSIS — L409 Psoriasis, unspecified: Secondary | ICD-10-CM | POA: Diagnosis not present

## 2019-04-07 DIAGNOSIS — I10 Essential (primary) hypertension: Secondary | ICD-10-CM | POA: Diagnosis not present

## 2019-04-07 MED ORDER — CLOBETASOL PROPIONATE 0.05 % EX CREA: 1.0000 "application " | TOPICAL_CREAM | Freq: Two times a day (BID) | CUTANEOUS | 11 refills | Status: AC

## 2019-04-07 MED ORDER — BUSPIRONE HCL 7.5 MG PO TABS: 7.5000 mg | ORAL_TABLET | Freq: Two times a day (BID) | ORAL | 3 refills | Status: DC

## 2019-04-07 MED ORDER — ATORVASTATIN CALCIUM 20 MG PO TABS: ORAL_TABLET | ORAL | 3 refills | Status: DC

## 2019-04-07 MED ORDER — METHOCARBAMOL 500 MG PO TABS: 500.0000 mg | ORAL_TABLET | Freq: Four times a day (QID) | ORAL | 2 refills | Status: DC | PRN

## 2019-04-07 MED ORDER — ATENOLOL 50 MG PO TABS: 50.0000 mg | ORAL_TABLET | Freq: Two times a day (BID) | ORAL | 3 refills | Status: DC

## 2019-04-07 NOTE — Progress Notes (Signed)
8/27/202012:23 PM  John Esparza August 01, 1966, 53 y.o., male [id number]  Chief Complaint  Patient presents with  . Hypertension  . stress    wants meds for stress    HPI:   Patient is a 53 y.o. male with past medical history significant for HTN, CAD, psoriatric arthritis who presents today for routine followup  Last OV April - telemedicine for flareup of psoriatric arthritis  pamelor rx by rheum to help with sleep, too sedating Stress work related Gad 7 = 9  Requesting refill of clobetasol for psoriasis  Depression screen [facility] 2/9 11/15/2018 06/19/2018 02/27/2018  Decreased Interest 0 0 0  Down, Depressed, Hopeless 0 0 0  PHQ - 2 Score 0 0 0    Fall Risk  11/15/2018 06/19/2018 02/27/2018 11/30/2017 09/05/2017  Falls in the past year? 0 0 No No No  Number falls in past yr: 0 - - - -     Allergies  Allergen Reactions  . Demerol [Meperidine]     Irregular heartbeat   . Morphine And Related Itching    Prior to Admission medications   Medication Sig Start Date End Date Taking? Authorizing Provider  aspirin 325 MG EC tablet Take 325 mg by mouth daily.   Yes [provider]  atenolol (TENORMIN) 50 MG tablet Take 1 tablet (50 mg total) by mouth 2 (two) times daily. 11/17/18  Yes Rutherford Guys, MD  atorvastatin (LIPITOR) 20 MG tablet TAKE 1 TABLET BY MOUTH ONCE DAILY AT [facility] 07/06/18  Yes Shawnee Knapp, MD  methocarbamol (ROBAXIN) 500 MG tablet Take 1 tablet (500 mg total) by mouth every 6 (six) hours as needed for muscle spasms. 11/15/18  Yes Rutherford Guys, MD  nortriptyline (PAMELOR) 10 MG capsule Take 1 capsule (10 mg total) by mouth at bedtime. X 1 week, the 2 tab po qhs x 2 weeks, then 3 tab po qhs 02/27/18  Yes Shawnee Knapp, MD    Past Medical History:  Diagnosis Date  . Anxiety   . Back problem   . HTN (hypertension)   . Hyperlipidemia   . MYOCARDIAL INFARCTION, ACUTE, SUBENDOCARDIAL 09/10/2009   Qualifier: Diagnosis of  By: Orville Govern CMA, Carol    . NSTEMI (non-ST  elevated myocardial infarction) ([place]) 1.1.2011   with two overlapping drug eluting stents mid lad  . Psoriasis   . Psoriatic arthritis ([place]) 04/20/2017    Past Surgical History:  Procedure Laterality Date  . APPENDECTOMY    . disk repla     L5-S1  . RETINAL DETACHMENT SURGERY  age 71   traumatic    Social History   Tobacco Use  . Smoking status: Never Smoker  . Smokeless tobacco: Never Used  Substance Use Topics  . Alcohol use: Yes    Alcohol/week: 0.0 standard drinks    Comment: drinks 12 packs of beer a week    Family History  Problem Relation Age of Onset  . Hypertension Mother        with copd  . COPD Mother   . Heart disease Mother        AMI  . Heart attack Father   . Hypertension Father        with copd  . COPD Father   . Heart attack Brother        three brothers  . Heart disease Brother   . Lupus Sister        twin  . Cancer Sister  cervical cancer  . Coronary artery disease Brother        died following procedure  . Heart disease Brother   . Heart disease Brother     ROS Per hpi  OBJECTIVE:  Today's Vitals   04/07/19 1204  BP: 130/78  Pulse: 65  Temp: 99 F (37.2 C)  SpO2: 95%  Weight: 255 lb (115.7 kg)  Height: 6' (1.829 m)   Body mass index is 34.58 kg/m.   Physical Exam Vitals signs and nursing note reviewed.  Constitutional:      Appearance: He is well-developed.  HENT:     Head: Normocephalic and atraumatic.  Eyes:     Conjunctiva/sclera: Conjunctivae normal.     Pupils: Pupils are equal, round, and reactive to light.  Neck:     Musculoskeletal: Neck supple.  Pulmonary:     Effort: Pulmonary effort is normal.  Skin:    General: Skin is warm and dry.  Neurological:     Mental Status: He is alert and oriented to person, place, and time.     No results found for this or any previous visit (from the past 24 hour(s)).  No results found.   ASSESSMENT and PLAN  1. Essential hypertension Controlled. Continue  current regime.  - Comprehensive metabolic panel - Lipid panel - CBC  2. Psoriasis Refilled clobetasol.  3. Work-related stress Started buspar. Reviewed r/se/b  Other orders - Secukinumab (COSENTYX) 150 MG/ML SOSY; Inject 150 mg into the skin every 30 (thirty) days. - clobetasol cream (TEMOVATE) 0.05 %; Apply 1 application topically 2 (two) times daily. - atenolol (TENORMIN) 50 MG tablet; Take 1 tablet (50 mg total) by mouth 2 (two) times daily. - atorvastatin (LIPITOR) 20 MG tablet; TAKE 1 TABLET BY MOUTH ONCE DAILY AT 6PM - methocarbamol (ROBAXIN) 500 MG tablet; Take 1 tablet (500 mg total) by mouth every 6 (six) hours as needed for muscle spasms. - busPIRone (BUSPAR) 7.5 MG tablet; Take 1 tablet (7.5 mg total) by mouth 2 (two) times daily.  Return in about 4 weeks (around 05/05/2019).    John LippsIrma M Santiago, MD Primary Care at [facility] [address] [place], [place] [address] Ph.  [phone] Fax [phone]

## 2019-04-08 LAB — CBC
Hematocrit: 48.9 % (ref 37.5–51.0)
Hemoglobin: 16.3 g/dL (ref 13.0–17.7)
MCH: 31.9 pg (ref 26.6–33.0)
MCHC: 33.3 g/dL (ref 31.5–35.7)
MCV: 96 fL (ref 79–97)
Platelets: 182 10*3/uL (ref 150–450)
RBC: 5.11 x10E6/uL (ref 4.14–5.80)
RDW: 12.5 % (ref 11.6–15.4)
WBC: 8.4 10*3/uL (ref 3.4–10.8)

## 2019-04-08 LAB — COMPREHENSIVE METABOLIC PANEL
ALT: 42 IU/L (ref 0–44)
AST: 24 IU/L (ref 0–40)
Albumin/Globulin Ratio: 1.6 (ref 1.2–2.2)
Albumin: 4.4 g/dL (ref 3.8–4.9)
Alkaline Phosphatase: 57 IU/L (ref 39–117)
BUN/Creatinine Ratio: 14 (ref 9–20)
BUN: 13 mg/dL (ref 6–24)
Bilirubin Total: 0.3 mg/dL (ref 0.0–1.2)
CO2: 24 mmol/L (ref 20–29)
Calcium: 9.3 mg/dL (ref 8.7–10.2)
Chloride: 98 mmol/L (ref 96–106)
Creatinine, Ser: 0.95 mg/dL (ref 0.76–1.27)
GFR calc Af Amer: 105 mL/min/{1.73_m2} (ref >59)
GFR calc non Af Amer: 91 mL/min/{1.73_m2} (ref >59)
Globulin, Total: 2.8 g/dL (ref 1.5–4.5)
Glucose: 96 mg/dL (ref 65–99)
Potassium: 4.4 mmol/L (ref 3.5–5.2)
Sodium: 138 mmol/L (ref 134–144)
Total Protein: 7.2 g/dL (ref 6.0–8.5)

## 2019-04-08 LAB — LIPID PANEL
Chol/HDL Ratio: 5.1 ratio — ABNORMAL HIGH (ref 0.0–5.0)
Cholesterol, Total: 250 mg/dL — ABNORMAL HIGH (ref 100–199)
HDL: 49 mg/dL (ref >39)
LDL Calculated: 137 mg/dL — ABNORMAL HIGH (ref 0–99)
Triglycerides: 320 mg/dL — ABNORMAL HIGH (ref 0–149)
VLDL Cholesterol Cal: 64 mg/dL — ABNORMAL HIGH (ref 5–40)

## 2019-04-25 ENCOUNTER — Other Ambulatory Visit: Payer: Self-pay | Admitting: Family Medicine

## 2019-04-25 MED ORDER — ATORVASTATIN CALCIUM 40 MG PO TABS: ORAL_TABLET | ORAL | 3 refills | Status: DC

## 2019-05-06 ENCOUNTER — Other Ambulatory Visit: Payer: Self-pay

## 2019-05-06 ENCOUNTER — Encounter: Payer: Self-pay | Admitting: Family Medicine

## 2019-05-06 ENCOUNTER — Ambulatory Visit: Payer: 59 | Admitting: Family Medicine

## 2019-05-06 VITALS — BP 130/82 | HR 67 | Temp 98.7°F | Ht 72.0 in | Wt 255.0 lb

## 2019-05-06 DIAGNOSIS — E7800 Pure hypercholesterolemia, unspecified: Secondary | ICD-10-CM

## 2019-05-06 DIAGNOSIS — Z566 Other physical and mental strain related to work: Secondary | ICD-10-CM

## 2019-05-06 DIAGNOSIS — I10 Essential (primary) hypertension: Secondary | ICD-10-CM

## 2019-05-06 DIAGNOSIS — L4050 Arthropathic psoriasis, unspecified: Secondary | ICD-10-CM

## 2019-05-06 MED ORDER — BUSPIRONE HCL 7.5 MG PO TABS: 7.5000 mg | ORAL_TABLET | Freq: Two times a day (BID) | ORAL | 5 refills | Status: DC

## 2019-05-06 MED ORDER — ATORVASTATIN CALCIUM 40 MG PO TABS: ORAL_TABLET | ORAL | 3 refills | Status: DC

## 2019-05-06 MED ORDER — ATENOLOL 50 MG PO TABS: 50.0000 mg | ORAL_TABLET | Freq: Two times a day (BID) | ORAL | 3 refills | Status: DC

## 2019-05-06 NOTE — Progress Notes (Signed)
9/25/20208:54 AM  John Esparza 08-02-1966, 53 y.o., male [id number]  Chief Complaint  Patient presents with  . Hypertension    refill on tenormin    HPI:   Patient is a 53 y.o. male with past medical history significant for HTN, CAD, psoriatric arthritis, GAD who presents today for routine followup  Last OV aug 2020 Started buspar for anxiety - working, anxiety better, happy with current medication and dose, denies any side effects Increased atorvastatin to 40mg  daily - has not started yet  Sees rheum next week, having increased morning stiffness of hips and shoulders  Depression screen [facility] 2/9 05/06/2019 11/15/2018 06/19/2018  Decreased Interest 0 0 0  Down, Depressed, Hopeless 0 0 0  PHQ - 2 Score 0 0 0    Fall Risk  05/06/2019 11/15/2018 06/19/2018 02/27/2018 11/30/2017  Falls in the past year? 0 0 0 No No  Number falls in past yr: 0 0 - - -  Injury with Fall? 0 - - - -     Allergies  Allergen Reactions  . Demerol [Meperidine]     Irregular heartbeat   . Morphine And Related Itching    Prior to Admission medications   Medication Sig Start Date End Date Taking? Authorizing Provider  aspirin 325 MG EC tablet Take 325 mg by mouth daily.   Yes [provider]  atenolol (TENORMIN) 50 MG tablet Take 1 tablet (50 mg total) by mouth 2 (two) times daily. 04/07/19  Yes 04/09/19, MD  atorvastatin (LIPITOR) 40 MG tablet TAKE 1 TABLET BY MOUTH ONCE DAILY AT 6PM 04/25/19  Yes 04/27/19, MD  busPIRone (BUSPAR) 7.5 MG tablet Take 1 tablet (7.5 mg total) by mouth 2 (two) times daily. 04/07/19  Yes 04/09/19, MD  clobetasol cream (TEMOVATE) 0.05 % Apply 1 application topically 2 (two) times daily. 04/07/19  Yes 04/09/19, MD  methocarbamol (ROBAXIN) 500 MG tablet Take 1 tablet (500 mg total) by mouth every 6 (six) hours as needed for muscle spasms. 04/07/19  Yes 04/09/19, MD  Secukinumab (COSENTYX) 150 MG/ML SOSY Inject 150 mg into the skin every 30  (thirty) days.   Yes [provider]    Past Medical History:  Diagnosis Date  . Anxiety   . Back problem   . HTN (hypertension)   . Hyperlipidemia   . MYOCARDIAL INFARCTION, ACUTE, SUBENDOCARDIAL 09/10/2009   Qualifier: Diagnosis of  By: 09/12/2009 CMA, Carol    . NSTEMI (non-ST elevated myocardial infarction) (HCC) 1.1.2011   with two overlapping drug eluting stents mid lad  . Psoriasis   . Psoriatic arthritis (HCC) 04/20/2017    Past Surgical History:  Procedure Laterality Date  . APPENDECTOMY    . disk repla     L5-S1  . RETINAL DETACHMENT SURGERY  age 6   traumatic    Social History   Tobacco Use  . Smoking status: Never Smoker  . Smokeless tobacco: Never Used  Substance Use Topics  . Alcohol use: Yes    Alcohol/week: 0.0 standard drinks    Comment: drinks 12 packs of beer a week    Family History  Problem Relation Age of Onset  . Hypertension Mother        with copd  . COPD Mother   . Heart disease Mother        AMI  . Heart attack Father   . Hypertension Father        with copd  .  COPD Father   . Heart attack Brother        three brothers  . Heart disease Brother   . Lupus Sister        twin  . Cancer Sister        cervical cancer  . Coronary artery disease Brother        died following procedure  . Heart disease Brother   . Heart disease Brother     Review of Systems  Constitutional: Negative for chills and fever.  Respiratory: Negative for cough and shortness of breath.   Cardiovascular: Negative for chest pain, palpitations and leg swelling.  Gastrointestinal: Negative for abdominal pain, nausea and vomiting.   Per hpi  OBJECTIVE:  Today's Vitals   05/06/19 0837  BP: 130/82  Pulse: 99  Temp: 98.7 F (37.1 C)  SpO2: 95%  Weight: 255 lb (115.7 kg)  Height: 6' (1.829 m)   Body mass index is 34.58 kg/m.   Physical Exam Vitals signs and nursing note reviewed.  Constitutional:      Appearance: He is well-developed.   HENT:     Head: Normocephalic and atraumatic.  Eyes:     Conjunctiva/sclera: Conjunctivae normal.     Pupils: Pupils are equal, round, and reactive to light.  Neck:     Musculoskeletal: Neck supple.  Pulmonary:     Effort: Pulmonary effort is normal.  Skin:    General: Skin is warm and dry.  Neurological:     Mental Status: He is alert and oriented to person, place, and time.     No results found for this or any previous visit (from the past 24 hour(s)).  No results found.   ASSESSMENT and PLAN  1. Work-related stress Much improved. Cont current mgt  2. Pure hypercholesterolemia Start increased dose, recheck labs at next OV  3. Essential hypertension Controlled. Continue current regime.   4. Psoriatic arthritis ([place]) Discussed increased stiffness/pain at upcoming appt with rheum  Other orders - atenolol (TENORMIN) 50 MG tablet; Take 1 tablet (50 mg total) by mouth 2 (two) times daily. - atorvastatin (LIPITOR) 40 MG tablet; TAKE 1 TABLET BY MOUTH ONCE DAILY AT 6PM - busPIRone (BUSPAR) 7.5 MG tablet; Take 1 tablet (7.5 mg total) by mouth 2 (two) times daily.  Return in about 3 months (around 08/05/2019).    Rutherford Guys, MD Primary Care at [facility] [place], [place] [address] Ph.  [phone] Fax [phone]

## 2019-05-06 NOTE — Patient Instructions (Signed)
° ° ° °  If you have lab work done today you will be contacted with your lab results within the next 2 weeks.  If you have not heard from us then please contact us. The fastest way to get your results is to register for My Chart. ° ° °IF you received an x-ray today, you will receive an invoice from [facility]. Please contact [facility] at [phone] with questions or concerns regarding your invoice.  ° °IF you received labwork today, you will receive an invoice from [facility]. Please contact [facility] at [phone] with questions or concerns regarding your invoice.  ° °Our billing staff will not be able to assist you with questions regarding bills from these companies. ° °You will be contacted with the lab results as soon as they are available. The fastest way to get your results is to activate your My Chart account. Instructions are located on the last page of this paperwork. If you have not heard from us regarding the results in 2 weeks, please contact this office. °  ° ° ° °

## 2019-06-23 ENCOUNTER — Encounter: Payer: Self-pay | Admitting: Emergency Medicine

## 2019-06-23 ENCOUNTER — Other Ambulatory Visit: Payer: Self-pay

## 2019-06-23 ENCOUNTER — Ambulatory Visit: Payer: 59 | Admitting: Emergency Medicine

## 2019-06-23 VITALS — BP 138/86 | HR 59 | Temp 98.5°F | Resp 16 | Ht 71.0 in | Wt 264.4 lb

## 2019-06-23 DIAGNOSIS — F32A Depression, unspecified: Secondary | ICD-10-CM

## 2019-06-23 DIAGNOSIS — L4050 Arthropathic psoriasis, unspecified: Secondary | ICD-10-CM | POA: Diagnosis not present

## 2019-06-23 DIAGNOSIS — M2550 Pain in unspecified joint: Secondary | ICD-10-CM | POA: Diagnosis not present

## 2019-06-23 DIAGNOSIS — F419 Anxiety disorder, unspecified: Secondary | ICD-10-CM | POA: Diagnosis not present

## 2019-06-23 DIAGNOSIS — F329 Major depressive disorder, single episode, unspecified: Secondary | ICD-10-CM

## 2019-06-23 MED ORDER — METHYLPREDNISOLONE ACETATE 80 MG/ML IJ SUSP
80.0000 mg | Freq: Once | INTRAMUSCULAR | Status: AC
  Administered 2019-06-23: 80 mg via INTRAMUSCULAR

## 2019-06-23 MED ORDER — PREDNISONE 20 MG PO TABS: 40.0000 mg | ORAL_TABLET | Freq: Every day | ORAL | 0 refills | Status: AC

## 2019-06-23 NOTE — Patient Instructions (Addendum)
If you have lab work done today you will be contacted with your lab results within the next 2 weeks.  If you have not heard from [place] then please contact us. The fastest way to get your results is to register for My Chart.   IF you received an x-ray today, you will receive an invoice from [facility]. Please contact [facility] at [phone] with questions or concerns regarding your invoice.   IF you received labwork today, you will receive an invoice from [place]. Please contact [facility] at [phone] with questions or concerns regarding your invoice.   Our billing staff will not be able to assist you with questions regarding bills from these companies.  You will be contacted with the lab results as soon as they are available. The fastest way to get your results is to activate your My Chart account. Instructions are located on the last page of this paperwork. If you have not heard from [place] regarding the results in 2 weeks, please contact this office.     Psoriatic Arthritis Psoriatic arthritis is a long-term (chronic) condition that causes pain, swelling, and stiffness in the joints. The large joints of the legs, hips, and pelvis are most often affected. The joints in the neck and back may also be affected. Sometimes psoriatic arthritis can involve joints in the toes, fingers, wrists, and elbows. Most people with psoriatic arthritis have a chronic skin disease that causes itchy scales and patches to form on the skin (psoriasis) before they develop psoriatic arthritis. In some cases, a person may have psoriatic arthritis before or without having psoriasis. Psoriatic arthritis can be mild or severe. It may come and go or cause symptoms all the time. It may affect one joint, a few joints, or many joints. In severe cases, untreated psoriatic arthritis can cause joint damage. What are the causes? The exact cause of this condition is not known. Psoriatic arthritis is an  autoimmune disease. With this type of disease, the body's defense system (immune system) mistakenly attacks healthy tissues. If you have psoriasis, the immune system attacks the skin. With psoriatic arthritis, the immune system attacks joints and the tissues that connect muscles to joints (tendons). The disease may be activated by a trigger, such as an infection or stress. What increases the risk? You are more likely to develop this condition if:  You have psoriasis. This is the biggest risk factor. Most people who develop psoriatic arthritis have had psoriasis for 5 to 10 years.  You have a family history of psoriasis or psoriatic arthritis.  You are between the ages of 8 and 31. What are the signs or symptoms? The main symptom of this condition is inflammation of joints and tendons. Other symptoms may include:  Joint swelling.  Joint pain.  Joint stiffness, especially in the morning.  Swollen fingers and toes.  Pain in areas where tendons connect to bones.  Pain in the heel or sole of the foot.  Pitted and weak nails.  Tiredness (fatigue).  Eye redness. How is this diagnosed? This condition may be diagnosed based on:  Your symptoms and medical history.  A physical exam.  X-rays to look for joint inflammation or damage, especially in the joints of the pelvis (sacroiliac joints).  Other imaging tests, such as a CT scan or MRI.  Blood tests to look for inflammation and to rule out other causes of joint inflammation, such as gout or rheumatoid arthritis. How is this treated? The goal of  treatment is to reduce pain and inflammation and protect joints from damage. Treatment depends on how severe the inflammation is and how many joints are affected. Treatment may include:  Medicines, such as: ? NSAIDs to relieve pain and inflammation. For people with mild disease, these may be the only medicines needed. ? Disease-modifying antirheumatic drugs (DMARDs). ? Biologic medicines.  These may be used for severe inflammation or if other medicines are not working. These medicines are usually given as injections or through an IV. They are very effective for many people with inflammatory arthritis, but there is an increased risk of infection.  Physical therapy and other exercise to strengthen muscles that support joints and to prevent joint stiffness.  A brace or splint to support a painful and swollen joint.  Surgery to reconstruct or replace a joint. This may be an option for people with severe joint damage if other treatments have not helped. Follow these instructions at home: Medicines  Take over-the-counter and prescription medicines only as told by your health care provider.  Be aware of the possible side effects of your medicine and know when to call your health care provider.  If you are taking a biologic, let your health care provider know if you have signs or symptoms of an infection. You may need to stop treatment until your infection clears up. Managing pain, stiffness, and swelling   If directed, put ice on painful areas. ? Put ice in a plastic bag. ? Place a towel between your skin and the bag. ? Leave the ice on for 20 minutes, 2-3 times a day. If you have a brace or splint:  Wear the brace or splint as told by your health care provider. Remove it only as told by your health care provider.  Loosen the brace or splint if your fingers or toes tingle, become numb, or turn cold and blue.  Keep the brace or splint clean.  If the brace or splint is not waterproof: ? Do not let it get wet. ? Cover it with a watertight covering when you take a bath or shower. Activity  Return to your normal activities as told by your health care provider. Ask your health care provider what activities are safe for you.  Get regular exercise. Ask your health care provider what type of exercise is best for you. Your health care provider may recommend: ? Low-impact exercises  such as walking, biking, or swimming. ? Exercises that include stretching, such as tai chi and yoga.  Do not exercise when you have a flare of symptoms. Rest until the symptoms improve. Eating and drinking   Do not drink alcohol if you are taking an NSAID. Alcohol and NSAIDs can cause stomach irritation.  Eat a healthy diet that includes plenty of vegetables, fruits, whole grains, low-fat dairy products, and lean protein. Do not eat a lot of foods that are high in solid fats, added sugars, or salt. General instructions  Do not use any products that contain nicotine or tobacco, such as cigarettes, e-cigarettes, and chewing tobacco. These can make arthritis worse. If you need help quitting, ask your health care provider.  Maintain a healthy weight. A healthy weight will help you stay active and take stress off your joints.  Stay up to date on all immunizations, including the yearly (annual) flu vaccine.  Keep all follow-up visits as told by your health care provider. This is important. Where to find more information  American Academy of Dermatology: http://jones-macias.info/  American College  of Rheumatology: www.rheumatology.[place]: www.psoriasis.org Contact a health care provider if:  Your signs and symptoms flare up.  You have side effects from your medicines.  You are taking a biologic and you have a fever or other signs of infection, such as: ? Chills. ? Feeling tired. ? Cough or sore throat. ? Loss of appetite. Summary  Psoriatic arthritis is an autoimmune disease that causes joint pain, swelling, and stiffness.  Most people with psoriatic arthritis have the skin disease called psoriasis first.  You may have imaging tests and blood tests to help your health care provider diagnose this condition.  The goal of treatment is to reduce pain and inflammation and protect joints from damage.  Medicines can relieve symptoms and prevent joint damage. This  information is not intended to replace advice given to you by your health care provider. Make sure you discuss any questions you have with your health care provider. Document Released: 04/01/2018 Document Revised: 11/23/2018 Document Reviewed: 04/01/2018 Elsevier Patient Education  2020 [employer].

## 2019-06-23 NOTE — Progress Notes (Signed)
John Esparza 53 y.o.   Chief Complaint  Patient presents with   Arthritis    painful legs for the last few months   Fatigue    HISTORY OF PRESENT ILLNESS: This is a 53 y.o. male with history of psoriatic arthritis presently on Cosentyx and Naprosyn but not helping much at present time.  Increased stress with increased pain.  Recently started on BuSpar 7.5 mg twice a day.  Has had success in the past with corticosteroids.  Does not feel well most of the time.  Recently tested negative for Covid infection.  Feels like he is overall condition, autoimmune condition, is getting worse.  Complaining of intermittent swelling of neck glands.  Multiple joint pains.  HPI   Prior to Admission medications   Medication Sig Start Date End Date Taking? Authorizing Provider  aspirin 325 MG EC tablet Take 325 mg by mouth daily.    [provider]  atenolol (TENORMIN) 50 MG tablet Take 1 tablet (50 mg total) by mouth 2 (two) times daily. 05/06/19   Rutherford Guys, MD  atorvastatin (LIPITOR) 40 MG tablet TAKE 1 TABLET BY MOUTH ONCE DAILY AT [facility] 05/06/19   Rutherford Guys, MD  busPIRone (BUSPAR) 7.5 MG tablet Take 1 tablet (7.5 mg total) by mouth 2 (two) times daily. 05/06/19   Rutherford Guys, MD  clobetasol cream (TEMOVATE) 2.72 % Apply 1 application topically 2 (two) times daily. 04/07/19   Rutherford Guys, MD  methocarbamol (ROBAXIN) 500 MG tablet Take 1 tablet (500 mg total) by mouth every 6 (six) hours as needed for muscle spasms. 04/07/19   Rutherford Guys, MD  Secukinumab (COSENTYX) 150 MG/ML SOSY Inject 150 mg into the skin every 30 (thirty) days.    [provider]    Allergies  Allergen Reactions   Demerol [Meperidine]     Irregular heartbeat    Morphine And Related Itching    Patient Active Problem List   Diagnosis Date Noted   Family history of lupus erythematosus 09/04/2016   Psoriatic arthritis ([place]) 08/20/2016   Psoriasis 12/29/2015   CAD, NATIVE VESSEL  09/21/2009   Essential hypertension 09/10/2009   NEPHROLITHIASIS, HX OF 09/10/2009    Past Medical History:  Diagnosis Date   Anxiety    Back problem    HTN (hypertension)    Hyperlipidemia    MYOCARDIAL INFARCTION, ACUTE, SUBENDOCARDIAL 09/10/2009   Qualifier: Diagnosis of  By: Orville Govern, CMA, Carol     NSTEMI (non-ST elevated myocardial infarction) ([place]) 1.1.2011   with two overlapping drug eluting stents mid lad   Psoriasis    Psoriatic arthritis ([place]) 04/20/2017    Past Surgical History:  Procedure Laterality Date   APPENDECTOMY     disk repla     L5-S1   RETINAL DETACHMENT SURGERY  age 47   traumatic    Social History   Socioeconomic History   Marital status: Single    Spouse name: Not on file   Number of children: Not on file   Years of education: Not on file   Highest education level: Not on file  Occupational History   Not on file  Social Needs   Financial resource strain: Not on file   Food insecurity    Worry: Not on file    Inability: Not on file   Transportation needs    Medical: Not on file    Non-medical: Not on file  Tobacco Use   Smoking status: Never Smoker  Smokeless tobacco: Never Used  Substance and Sexual Activity   Alcohol use: Yes    Alcohol/week: 0.0 standard drinks    Comment: drinks 12 packs of beer a week   Drug use: No   Sexual activity: Never  Lifestyle   Physical activity    Days per week: Not on file    Minutes per session: Not on file   Stress: Not on file  Relationships   Social connections    Talks on phone: Not on file    Gets together: Not on file    Attends religious service: Not on file    Active member of club or organization: Not on file    Attends meetings of clubs or organizations: Not on file    Relationship status: Not on file   Intimate partner violence    Fear of current or ex partner: Not on file    Emotionally abused: Not on file    Physically abused: Not on file    Forced  sexual activity: Not on file  Other Topics Concern   Not on file  Social History Narrative   Marital status: single;       Children: 2 daughters; 1 grandson      Lives: with daughter, grandson      Employment:  Engineer, structural for [employer]; none      Alcohol:  Weekends      Exercise:  daily    Family History  Problem Relation Age of Onset   Hypertension Mother        with copd   COPD Mother    Heart disease Mother        AMI   Heart attack Father    Hypertension Father        with copd   COPD Father    Heart attack Brother        three brothers   Heart disease Brother    Lupus Sister        twin   Cancer Sister        cervical cancer   Coronary artery disease Brother        died following procedure   Heart disease Brother    Heart disease Brother      Review of Systems  Constitutional: Positive for malaise/fatigue.  HENT: Negative.  Negative for congestion and sore throat.   Eyes: Negative.  Negative for blurred vision and double vision.  Respiratory: Negative.  Negative for cough and shortness of breath.   Cardiovascular: Negative.  Negative for chest pain and palpitations.  Gastrointestinal: Negative.  Negative for abdominal pain, nausea and vomiting.  Musculoskeletal: Positive for back pain and joint pain.  Skin:       Swollen neck glands  Endo/Heme/Allergies: Negative.   Psychiatric/Behavioral: Positive for depression. The patient is nervous/anxious.        Increased overall stress  All other systems reviewed and are negative.  Today's Vitals   06/23/19 1145  BP: 138/86  Pulse: (!) 59  Resp: 16  Temp: 98.5 F (36.9 C)  TempSrc: Oral  SpO2: 95%  Weight: 264 lb 6.4 oz (119.9 kg)  Height: _0  (1.803 m)   Body mass index is 36.88 kg/m.   Physical Exam Vitals signs reviewed.  Constitutional:      Appearance: Normal appearance.  HENT:     Head: Normocephalic.  Eyes:     Extraocular Movements:  Extraocular movements intact.  Pupils: Pupils are equal, round, and reactive to light.  Neck:     Musculoskeletal: Normal range of motion and neck supple. No muscular tenderness.  Cardiovascular:     Rate and Rhythm: Normal rate.     Heart sounds: Normal heart sounds.  Pulmonary:     Effort: Pulmonary effort is normal.     Breath sounds: Normal breath sounds.  Musculoskeletal: Normal range of motion.        General: No swelling, tenderness or deformity.     Right lower leg: No edema.     Left lower leg: No edema.  Lymphadenopathy:     Cervical: No cervical adenopathy.  Skin:    General: Skin is warm and dry.     Capillary Refill: Capillary refill takes less than 2 seconds.  Neurological:     General: No focal deficit present.     Mental Status: He is alert and oriented to person, place, and time.  Psychiatric:        Mood and Affect: Mood normal.        Behavior: Behavior normal.      ASSESSMENT & PLAN: [place] was seen today for arthritis and fatigue.  Diagnoses and all orders for this visit:  Psoriatic arthritis ([place]) -     predniSONE (DELTASONE) 20 MG tablet; Take 2 tablets (40 mg total) by mouth daily with breakfast for 5 days. -     methylPREDNISolone acetate (DEPO-MEDROL) injection 80 mg  Arthralgia of multiple joints -     predniSONE (DELTASONE) 20 MG tablet; Take 2 tablets (40 mg total) by mouth daily with breakfast for 5 days. -     methylPREDNISolone acetate (DEPO-MEDROL) injection 80 mg  Anxiety and depression    Patient Instructions       If you have lab work done today you will be contacted with your lab results within the next 2 weeks.  If you have not heard from [place] then please contact us. The fastest way to get your results is to register for My Chart.   IF you received an x-ray today, you will receive an invoice from [facility]. Please contact [facility] at [phone] with questions or concerns regarding your invoice.    IF you received labwork today, you will receive an invoice from [place]. Please contact [facility] at [phone] with questions or concerns regarding your invoice.   Our billing staff will not be able to assist you with questions regarding bills from these companies.  You will be contacted with the lab results as soon as they are available. The fastest way to get your results is to activate your My Chart account. Instructions are located on the last page of this paperwork. If you have not heard from [place] regarding the results in 2 weeks, please contact this office.     Psoriatic Arthritis Psoriatic arthritis is a long-term (chronic) condition that causes pain, swelling, and stiffness in the joints. The large joints of the legs, hips, and pelvis are most often affected. The joints in the neck and back may also be affected. Sometimes psoriatic arthritis can involve joints in the toes, fingers, wrists, and elbows. Most people with psoriatic arthritis have a chronic skin disease that causes itchy scales and patches to form on the skin (psoriasis) before they develop psoriatic arthritis. In some cases, a person may have psoriatic arthritis before or without having psoriasis. Psoriatic arthritis can be mild or severe. It may come and go or cause symptoms all the time.  It may affect one joint, a few joints, or many joints. In severe cases, untreated psoriatic arthritis can cause joint damage. What are the causes? The exact cause of this condition is not known. Psoriatic arthritis is an autoimmune disease. With this type of disease, the body's defense system (immune system) mistakenly attacks healthy tissues. If you have psoriasis, the immune system attacks the skin. With psoriatic arthritis, the immune system attacks joints and the tissues that connect muscles to joints (tendons). The disease may be activated by a trigger, such as an infection or stress. What increases the risk? You are more likely to  develop this condition if:  You have psoriasis. This is the biggest risk factor. Most people who develop psoriatic arthritis have had psoriasis for 5 to 10 years.  You have a family history of psoriasis or psoriatic arthritis.  You are between the ages of 76 and 43. What are the signs or symptoms? The main symptom of this condition is inflammation of joints and tendons. Other symptoms may include:  Joint swelling.  Joint pain.  Joint stiffness, especially in the morning.  Swollen fingers and toes.  Pain in areas where tendons connect to bones.  Pain in the heel or sole of the foot.  Pitted and weak nails.  Tiredness (fatigue).  Eye redness. How is this diagnosed? This condition may be diagnosed based on:  Your symptoms and medical history.  A physical exam.  X-rays to look for joint inflammation or damage, especially in the joints of the pelvis (sacroiliac joints).  Other imaging tests, such as a CT scan or MRI.  Blood tests to look for inflammation and to rule out other causes of joint inflammation, such as gout or rheumatoid arthritis. How is this treated? The goal of treatment is to reduce pain and inflammation and protect joints from damage. Treatment depends on how severe the inflammation is and how many joints are affected. Treatment may include:  Medicines, such as: ? NSAIDs to relieve pain and inflammation. For people with mild disease, these may be the only medicines needed. ? Disease-modifying antirheumatic drugs (DMARDs). ? Biologic medicines. These may be used for severe inflammation or if other medicines are not working. These medicines are usually given as injections or through an IV. They are very effective for many people with inflammatory arthritis, but there is an increased risk of infection.  Physical therapy and other exercise to strengthen muscles that support joints and to prevent joint stiffness.  A brace or splint to support a painful and  swollen joint.  Surgery to reconstruct or replace a joint. This may be an option for people with severe joint damage if other treatments have not helped. Follow these instructions at home: Medicines  Take over-the-counter and prescription medicines only as told by your health care provider.  Be aware of the possible side effects of your medicine and know when to call your health care provider.  If you are taking a biologic, let your health care provider know if you have signs or symptoms of an infection. You may need to stop treatment until your infection clears up. Managing pain, stiffness, and swelling   If directed, put ice on painful areas. ? Put ice in a plastic bag. ? Place a towel between your skin and the bag. ? Leave the ice on for 20 minutes, 2-3 times a day. If you have a brace or splint:  Wear the brace or splint as told by your health care provider. Remove it  only as told by your health care provider.  Loosen the brace or splint if your fingers or toes tingle, become numb, or turn cold and blue.  Keep the brace or splint clean.  If the brace or splint is not waterproof: ? Do not let it get wet. ? Cover it with a watertight covering when you take a bath or shower. Activity  Return to your normal activities as told by your health care provider. Ask your health care provider what activities are safe for you.  Get regular exercise. Ask your health care provider what type of exercise is best for you. Your health care provider may recommend: ? Low-impact exercises such as walking, biking, or swimming. ? Exercises that include stretching, such as tai chi and yoga.  Do not exercise when you have a flare of symptoms. Rest until the symptoms improve. Eating and drinking   Do not drink alcohol if you are taking an NSAID. Alcohol and NSAIDs can cause stomach irritation.  Eat a healthy diet that includes plenty of vegetables, fruits, whole grains, low-fat dairy products,  and lean protein. Do not eat a lot of foods that are high in solid fats, added sugars, or salt. General instructions  Do not use any products that contain nicotine or tobacco, such as cigarettes, e-cigarettes, and chewing tobacco. These can make arthritis worse. If you need help quitting, ask your health care provider.  Maintain a healthy weight. A healthy weight will help you stay active and take stress off your joints.  Stay up to date on all immunizations, including the yearly (annual) flu vaccine.  Keep all follow-up visits as told by your health care provider. This is important. Where to find more information  American Academy of Dermatology: http://jones-macias.info/  American College of Rheumatology: www.rheumatology.[place]: www.psoriasis.org Contact a health care provider if:  Your signs and symptoms flare up.  You have side effects from your medicines.  You are taking a biologic and you have a fever or other signs of infection, such as: ? Chills. ? Feeling tired. ? Cough or sore throat. ? Loss of appetite. Summary  Psoriatic arthritis is an autoimmune disease that causes joint pain, swelling, and stiffness.  Most people with psoriatic arthritis have the skin disease called psoriasis first.  You may have imaging tests and blood tests to help your health care provider diagnose this condition.  The goal of treatment is to reduce pain and inflammation and protect joints from damage.  Medicines can relieve symptoms and prevent joint damage. This information is not intended to replace advice given to you by your health care provider. Make sure you discuss any questions you have with your health care provider. Document Released: 04/01/2018 Document Revised: 11/23/2018 Document Reviewed: 04/01/2018 Elsevier Patient Education  2020 [employer]      Agustina Caroli, MD [facility]

## 2019-06-27 ENCOUNTER — Telehealth (INDEPENDENT_AMBULATORY_CARE_PROVIDER_SITE_OTHER): Payer: 59 | Admitting: Family Medicine

## 2019-06-27 ENCOUNTER — Encounter: Payer: Self-pay | Admitting: Family Medicine

## 2019-06-27 VITALS — Ht 71.0 in | Wt 264.0 lb

## 2019-06-27 DIAGNOSIS — T466X5A Adverse effect of antihyperlipidemic and antiarteriosclerotic drugs, initial encounter: Secondary | ICD-10-CM | POA: Diagnosis not present

## 2019-06-27 DIAGNOSIS — M791 Myalgia, unspecified site: Secondary | ICD-10-CM | POA: Diagnosis not present

## 2019-06-27 NOTE — Progress Notes (Signed)
Virtual Visit Note  I connected with patient on 06/27/19 at 556pm by phone and verified that I am speaking with the correct person using two identifiers. John Esparza is currently located at home and patient is currently with them during visit. The provider, Rutherford Guys, MD is located in their office at time of visit.  I discussed the limitations, risks, security and privacy concerns of performing an evaluation and management service by telephone and the availability of in person appointments. I also discussed with the patient that there may be a patient responsible charge related to this service. The patient expressed understanding and agreed to proceed.   CC: not feeling well  HPI  For past 6-7 weeks he starting have constant malaise, lightheaded, dizziness, joint and muscle pain, bad leg pains This past week he started having sore throat and fluttering in his chest Not able to get comfortable at night Has been tested for covid x 3, negative Today had to leave work No fevers Was talking to his sister and she asked "have you changed anything" and that is when he realized that these symptoms started after increase atorvastatin from 20mg  and 40mg  He takes atorvastatin every morning No dark urine   Allergies  Allergen Reactions  . Demerol [Meperidine]     Irregular heartbeat   . Morphine And Related Itching    Prior to Admission medications   Medication Sig Start Date End Date Taking? Authorizing Provider  aspirin 325 MG EC tablet Take 325 mg by mouth daily.   Yes [provider]  atenolol (TENORMIN) 50 MG tablet Take 1 tablet (50 mg total) by mouth 2 (two) times daily. 05/06/19  Yes Rutherford Guys, MD  atorvastatin (LIPITOR) 40 MG tablet TAKE 1 TABLET BY MOUTH ONCE DAILY AT 6PM 05/06/19  Yes Rutherford Guys, MD  busPIRone (BUSPAR) 7.5 MG tablet Take 1 tablet (7.5 mg total) by mouth 2 (two) times daily. 05/06/19  Yes Rutherford Guys, MD  clobetasol cream (TEMOVATE)  4.03 % Apply 1 application topically 2 (two) times daily. 04/07/19  Yes Rutherford Guys, MD  methocarbamol (ROBAXIN) 500 MG tablet Take 1 tablet (500 mg total) by mouth every 6 (six) hours as needed for muscle spasms. 04/07/19  Yes Rutherford Guys, MD  predniSONE (DELTASONE) 20 MG tablet Take 2 tablets (40 mg total) by mouth daily with breakfast for 5 days. 06/23/19 06/28/19 Yes Sagardia, Ines Bloomer, MD  Secukinumab (COSENTYX) 150 MG/ML SOSY Inject 150 mg into the skin every 30 (thirty) days.   Yes [provider]    Past Medical History:  Diagnosis Date  . Anxiety   . Back problem   . HTN (hypertension)   . Hyperlipidemia   . MYOCARDIAL INFARCTION, ACUTE, SUBENDOCARDIAL 09/10/2009   Qualifier: Diagnosis of  By: Orville Govern CMA, Carol    . NSTEMI (non-ST elevated myocardial infarction) ([place]) 1.1.2011   with two overlapping drug eluting stents mid lad  . Psoriasis   . Psoriatic arthritis ([place]) 04/20/2017    Past Surgical History:  Procedure Laterality Date  . APPENDECTOMY    . disk repla     L5-S1  . RETINAL DETACHMENT SURGERY  age 29   traumatic    Social History   Tobacco Use  . Smoking status: Never Smoker  . Smokeless tobacco: Never Used  Substance Use Topics  . Alcohol use: Yes    Alcohol/week: 0.0 standard drinks    Comment: drinks 12 packs of beer a week  Family History  Problem Relation Age of Onset  . Hypertension Mother        with copd  . COPD Mother   . Heart disease Mother        AMI  . Heart attack Father   . Hypertension Father        with copd  . COPD Father   . Heart attack Brother        three brothers  . Heart disease Brother   . Lupus Sister        twin  . Cancer Sister        cervical cancer  . Coronary artery disease Brother        died following procedure  . Heart disease Brother   . Heart disease Brother     ROS Per hpi  Objective  Vitals as reported by the patient: none   ASSESSMENT and PLAN  1. Myalgia due to  statin Stop atorvastatin, push fluids  FOLLOW-UP: 1 week   The above assessment and management plan was discussed with the patient. The patient verbalized understanding of and has agreed to the management plan. Patient is aware to call the clinic if symptoms persist or worsen. Patient is aware when to return to the clinic for a follow-up visit. Patient educated on when it is appropriate to go to the emergency department.    I provided 9 minutes of non-face-to-face time during this encounter.  Myles Lipps, MD Primary Care at [facility] [address] [place], [place] [address] Ph.  [phone] Fax [phone]

## 2019-07-14 ENCOUNTER — Other Ambulatory Visit: Payer: Self-pay

## 2019-07-14 ENCOUNTER — Telehealth (INDEPENDENT_AMBULATORY_CARE_PROVIDER_SITE_OTHER): Payer: 59 | Admitting: Family Medicine

## 2019-07-14 DIAGNOSIS — U071 COVID-19: Secondary | ICD-10-CM | POA: Diagnosis not present

## 2019-07-14 NOTE — Progress Notes (Signed)
Virtual Visit Note  I connected with patient on 07/14/19 at 558pm by phone and verified that I am speaking with the correct person using two identifiers. John Esparza is currently located at home and patient is currently with them during visit. The provider, Rutherford Guys, MD is located in their office at time of visit.  I discussed the limitations, risks, security and privacy concerns of performing an evaluation and management service by telephone and the availability of in person appointments. I also discussed with the patient that there may be a patient responsible charge related to this service. The patient expressed understanding and agreed to proceed.   CC: covid positive  HPI ? Tested positive covid 10 days  Fatigue, SOB, loss of taste Feels he is getting better Still fatigued and SOB with activity No cough, no fevers, no cp, no palpitations, no wheezes  Allergies  Allergen Reactions  . Demerol [Meperidine]     Irregular heartbeat   . Morphine And Related Itching    Prior to Admission medications   Medication Sig Start Date End Date Taking? Authorizing Provider  aspirin 325 MG EC tablet Take 325 mg by mouth daily.   Yes [provider]  busPIRone (BUSPAR) 7.5 MG tablet Take 1 tablet (7.5 mg total) by mouth 2 (two) times daily. 05/06/19  Yes Rutherford Guys, MD  clobetasol cream (TEMOVATE) 6.94 % Apply 1 application topically 2 (two) times daily. 04/07/19  Yes Rutherford Guys, MD  methocarbamol (ROBAXIN) 500 MG tablet Take 1 tablet (500 mg total) by mouth every 6 (six) hours as needed for muscle spasms. 04/07/19  Yes Rutherford Guys, MD  Secukinumab (COSENTYX) 150 MG/ML SOSY Inject 150 mg into the skin every 30 (thirty) days.   Yes [provider]  atenolol (TENORMIN) 50 MG tablet Take 1 tablet (50 mg total) by mouth 2 (two) times daily. Patient not taking: Reported on 07/14/2019 05/06/19   Rutherford Guys, MD    Past Medical History:  Diagnosis Date   . Anxiety   . Back problem   . HTN (hypertension)   . Hyperlipidemia   . MYOCARDIAL INFARCTION, ACUTE, SUBENDOCARDIAL 09/10/2009   Qualifier: Diagnosis of  By: Orville Govern CMA, Carol    . NSTEMI (non-ST elevated myocardial infarction) ([place]) 1.1.2011   with two overlapping drug eluting stents mid lad  . Psoriasis   . Psoriatic arthritis ([place]) 04/20/2017    Past Surgical History:  Procedure Laterality Date  . APPENDECTOMY    . disk repla     L5-S1  . RETINAL DETACHMENT SURGERY  age 80   traumatic    Social History   Tobacco Use  . Smoking status: Never Smoker  . Smokeless tobacco: Never Used  Substance Use Topics  . Alcohol use: Yes    Alcohol/week: 0.0 standard drinks    Comment: drinks 12 packs of beer a week    Family History  Problem Relation Age of Onset  . Hypertension Mother        with copd  . COPD Mother   . Heart disease Mother        AMI  . Heart attack Father   . Hypertension Father        with copd  . COPD Father   . Heart attack Brother        three brothers  . Heart disease Brother   . Lupus Sister        twin  . Cancer Sister  cervical cancer  . Coronary artery disease Brother        died following procedure  . Heart disease Brother   . Heart disease Brother     ROS Per hpi  Objective  Vitals as reported by the patient: none  Gen; AAOx3, NAD Speaking in full sentences, breathing comfortably   ASSESSMENT and PLAN  1. COVID-19 Recovering. Reviewed expectations of recovery and ssx of concern. Reviewed quarantine guidelines.  FOLLOW-UP: as scheduled   The above assessment and management plan was discussed with the patient. The patient verbalized understanding of and has agreed to the management plan. Patient is aware to call the clinic if symptoms persist or worsen. Patient is aware when to return to the clinic for a follow-up visit. Patient educated on when it is appropriate to go to the emergency department.    I provided 8  minutes of non-face-to-face time during this encounter.  Myles Lipps, MD Primary Care at [facility] [address] [place], [place] [address] Ph.  [phone] Fax [phone]

## 2019-07-14 NOTE — Progress Notes (Signed)
Has tested positive for covid, this is his 10th day with the virus. He is starting to improve in symptoms, just need is something to help with the sob. Gets really winded. He wants to make sure that this virus does not leave any residual effect on his lungs. Says he is no longer taking his cholesterol med, pharmacy is verified.

## 2019-08-04 ENCOUNTER — Other Ambulatory Visit: Payer: Self-pay

## 2019-08-04 ENCOUNTER — Ambulatory Visit: Payer: 59 | Admitting: Family Medicine

## 2019-08-04 ENCOUNTER — Encounter: Payer: Self-pay | Admitting: Family Medicine

## 2019-08-04 VITALS — BP 138/84 | HR 73 | Temp 98.3°F | Ht 71.0 in | Wt 264.8 lb

## 2019-08-04 DIAGNOSIS — I10 Essential (primary) hypertension: Secondary | ICD-10-CM | POA: Diagnosis not present

## 2019-08-04 DIAGNOSIS — M791 Myalgia, unspecified site: Secondary | ICD-10-CM | POA: Diagnosis not present

## 2019-08-04 DIAGNOSIS — Z566 Other physical and mental strain related to work: Secondary | ICD-10-CM

## 2019-08-04 DIAGNOSIS — M2550 Pain in unspecified joint: Secondary | ICD-10-CM

## 2019-08-04 DIAGNOSIS — I25119 Atherosclerotic heart disease of native coronary artery with unspecified angina pectoris: Secondary | ICD-10-CM

## 2019-08-04 DIAGNOSIS — T466X5A Adverse effect of antihyperlipidemic and antiarteriosclerotic drugs, initial encounter: Secondary | ICD-10-CM

## 2019-08-04 DIAGNOSIS — E7800 Pure hypercholesterolemia, unspecified: Secondary | ICD-10-CM

## 2019-08-04 MED ORDER — CELECOXIB 200 MG PO CAPS: 200.0000 mg | ORAL_CAPSULE | Freq: Every day | ORAL | 1 refills | Status: DC

## 2019-08-04 MED ORDER — ROSUVASTATIN CALCIUM 5 MG PO TABS: 5.0000 mg | ORAL_TABLET | Freq: Every day | ORAL | 0 refills | Status: DC

## 2019-08-04 MED ORDER — AMLODIPINE BESYLATE 2.5 MG PO TABS: 2.5000 mg | ORAL_TABLET | Freq: Every day | ORAL | 3 refills | Status: DC

## 2019-08-04 NOTE — Progress Notes (Signed)
12/24/20208:39 AM  John Esparza 12/31/1965, 53 y.o., male [id number]  Chief Complaint  Patient presents with  . Hypertension    just took bp meds a couple hours ago. No longer taking the robbaxin. Since increased gave him chest pain. No longer taking the consentyx, made him feel bas    HPI:   Patient is a 53 y.o. male with past medical history significant for HTN, CAD, psoriathric arthritis, GAD who presents today for routine followup  Stopped atorvastatin as thought might be causing myalgias, resolved   Tested covid positive - recovered well  Usually wakes up with BP 160/100 Once he takes his BP meds it comes down to 130/80 He does not snore  He has stopped taking cosentryx as he realized that it was making him feel lousy Sees rheum next week  He has been taking celebrex at bedtime for pain Sleeping really well  Takes buspar sporadically - he does feel it works for him Mostly work related  Lab Results  Component Value Date   CREATININE 0.95 04/07/2019   BUN 13 04/07/2019   NA 138 04/07/2019   K 4.4 04/07/2019   CL 98 04/07/2019   CO2 24 04/07/2019    Lab Results  Component Value Date   CHOL 250 (H) 04/07/2019   HDL 49 04/07/2019   LDLCALC 137 (H) 04/07/2019   LDLDIRECT 118 (H) 06/22/2017   TRIG 320 (H) 04/07/2019   CHOLHDL 5.1 (H) 04/07/2019    Depression screen PHQ 2/9 08/04/2019 07/14/2019 06/27/2019  Decreased Interest 0 0 0  Down, Depressed, Hopeless 0 0 0  PHQ - 2 Score 0 0 0    Fall Risk  08/04/2019 07/14/2019 06/27/2019 06/23/2019 05/06/2019  Falls in the past year? 0 0 0 0 0  Number falls in past yr: 0 0 0 - 0  Injury with Fall? 0 0 0 - 0  Follow up - - - Falls evaluation completed -     Allergies  Allergen Reactions  . Demerol [Meperidine]     Irregular heartbeat   . Morphine And Related Itching    Prior to Admission medications   Medication Sig Start Date End Date Taking? Authorizing Provider  aspirin 325 MG EC tablet Take 325 mg by  mouth daily.   Yes [provider]  atenolol (TENORMIN) 50 MG tablet Take 1 tablet (50 mg total) by mouth 2 (two) times daily. 05/06/19  Yes Myles LippsSantiago, Tippi Mccrae M, MD  busPIRone (BUSPAR) 7.5 MG tablet Take 1 tablet (7.5 mg total) by mouth 2 (two) times daily. 05/06/19  Yes Myles LippsSantiago, Takeyah Wieman M, MD  clobetasol cream (TEMOVATE) 0.05 % Apply 1 application topically 2 (two) times daily. 04/07/19  Yes Myles LippsSantiago, Delia Slatten M, MD  methocarbamol (ROBAXIN) 500 MG tablet Take 1 tablet (500 mg total) by mouth every 6 (six) hours as needed for muscle spasms. Patient not taking: Reported on 08/04/2019 04/07/19   Myles LippsSantiago, Alayiah Fontes M, MD  Secukinumab (COSENTYX) 150 MG/ML SOSY Inject 150 mg into the skin every 30 (thirty) days.    [provider]    Past Medical History:  Diagnosis Date  . Anxiety   . Back problem   . HTN (hypertension)   . Hyperlipidemia   . MYOCARDIAL INFARCTION, ACUTE, SUBENDOCARDIAL 09/10/2009   Qualifier: Diagnosis of  By: Denyse AmassFiato, CMA, Carol    . NSTEMI (non-ST elevated myocardial infarction) ([facility]) 1.1.2011   with two overlapping drug eluting stents mid lad  . Psoriasis   . Psoriatic arthritis ([facility]) 04/20/2017  Past Surgical History:  Procedure Laterality Date  . APPENDECTOMY    . disk repla     L5-S1  . RETINAL DETACHMENT SURGERY  age 61   traumatic    Social History   Tobacco Use  . Smoking status: Never Smoker  . Smokeless tobacco: Never Used  Substance Use Topics  . Alcohol use: Yes    Alcohol/week: 0.0 standard drinks    Comment: drinks 12 packs of beer a week    Family History  Problem Relation Age of Onset  . Hypertension Mother        with copd  . COPD Mother   . Heart disease Mother        AMI  . Heart attack Father   . Hypertension Father        with copd  . COPD Father   . Heart attack Brother        three brothers  . Heart disease Brother   . Lupus Sister        twin  . Cancer Sister        cervical cancer  . Coronary artery disease Brother          died following procedure  . Heart disease Brother   . Heart disease Brother     Review of Systems  Constitutional: Negative for chills and fever.  Respiratory: Negative for cough and shortness of breath.   Cardiovascular: Negative for chest pain, palpitations and leg swelling.  Gastrointestinal: Negative for abdominal pain, nausea and vomiting.     OBJECTIVE:  Today's Vitals   08/04/19 0829  BP: 138/84  Pulse: 73  Temp: 98.3 F (36.8 C)  SpO2: 97%  Weight: 264 lb 12.8 oz (120.1 kg)  Height: 5\' 11"  (1.803 m)   Body mass index is 36.93 kg/m.  BP Readings from Last 3 Encounters:  08/04/19 (!) 163/111  06/23/19 138/86  05/06/19 130/82    Physical Exam Vitals and nursing note reviewed.  Constitutional:      Appearance: He is well-developed.  HENT:     Head: Normocephalic and atraumatic.  Eyes:     Conjunctiva/sclera: Conjunctivae normal.     Pupils: Pupils are equal, round, and reactive to light.  Cardiovascular:     Rate and Rhythm: Normal rate and regular rhythm.     Heart sounds: No murmur. No friction rub. No gallop.   Pulmonary:     Effort: Pulmonary effort is normal.     Breath sounds: Normal breath sounds. No wheezing or rales.  Musculoskeletal:     Cervical back: Neck supple.  Skin:    General: Skin is warm and dry.  Neurological:     Mental Status: He is alert and oriented to person, place, and time.     No results found for this or any previous visit (from the past 24 hour(s)).  No results found.   ASSESSMENT and PLAN  1. Essential hypertension At goal.. however patient with nocturnal high BP, adding low dose amlodipine  2. Pure hypercholesterolemia 3. Myalgia due to statin 5. Atherosclerosis of native coronary artery of native heart with angina pectoris (HCC) Trial of low dose crestor, LDL goal < 70  4. Arthralgia of multiple joints celebrex at bedtime, managed by rheum  6. Work-related stress Stable. Cont with buspar  Other  orders - celecoxib (CELEBREX) 200 MG capsule; Take 1 capsule (200 mg total) by mouth at bedtime. - amLODipine (NORVASC) 2.5 MG tablet; Take 1 tablet (2.5 mg total) by  mouth daily. - rosuvastatin (CRESTOR) 5 MG tablet; Take 1 tablet (5 mg total) by mouth daily.  Return in about 3 months (around 11/02/2019).    Rutherford Guys, MD Primary Care at [facility] [place], [place] [address] Ph.  [phone] Fax [phone]

## 2019-08-04 NOTE — Patient Instructions (Signed)
° ° ° °  If you have lab work done today you will be contacted with your lab results within the next 2 weeks.  If you have not heard from us then please contact us. The fastest way to get your results is to register for My Chart. ° ° °IF you received an x-ray today, you will receive an invoice from [facility]. Please contact [facility] at [phone] with questions or concerns regarding your invoice.  ° °IF you received labwork today, you will receive an invoice from [facility]. Please contact [facility] at [phone] with questions or concerns regarding your invoice.  ° °Our billing staff will not be able to assist you with questions regarding bills from these companies. ° °You will be contacted with the lab results as soon as they are available. The fastest way to get your results is to activate your My Chart account. Instructions are located on the last page of this paperwork. If you have not heard from us regarding the results in 2 weeks, please contact this office. °  ° ° ° °

## 2019-11-03 ENCOUNTER — Ambulatory Visit: Payer: 59 | Admitting: Family Medicine

## 2019-11-03 ENCOUNTER — Other Ambulatory Visit: Payer: Self-pay

## 2019-11-03 ENCOUNTER — Encounter: Payer: Self-pay | Admitting: Family Medicine

## 2019-11-03 VITALS — BP 130/94 | HR 69 | Temp 98.3°F | Ht 71.0 in | Wt 260.0 lb

## 2019-11-03 DIAGNOSIS — E7800 Pure hypercholesterolemia, unspecified: Secondary | ICD-10-CM

## 2019-11-03 DIAGNOSIS — Z566 Other physical and mental strain related to work: Secondary | ICD-10-CM

## 2019-11-03 DIAGNOSIS — L4050 Arthropathic psoriasis, unspecified: Secondary | ICD-10-CM

## 2019-11-03 DIAGNOSIS — I10 Essential (primary) hypertension: Secondary | ICD-10-CM

## 2019-11-03 MED ORDER — BUSPIRONE HCL 7.5 MG PO TABS: 7.5000 mg | ORAL_TABLET | Freq: Two times a day (BID) | ORAL | 5 refills | Status: DC

## 2019-11-03 MED ORDER — ATENOLOL 100 MG PO TABS: 100.0000 mg | ORAL_TABLET | Freq: Every day | ORAL | 1 refills | Status: DC

## 2019-11-03 MED ORDER — AMLODIPINE BESYLATE 5 MG PO TABS: 5.0000 mg | ORAL_TABLET | Freq: Every day | ORAL | 1 refills | Status: DC

## 2019-11-03 NOTE — Patient Instructions (Signed)
° ° ° °  If you have lab work done today you will be contacted with your lab results within the next 2 weeks.  If you have not heard from us then please contact us. The fastest way to get your results is to register for My Chart. ° ° °IF you received an x-ray today, you will receive an invoice from [facility]. Please contact  Radiology at [phone] with questions or concerns regarding your invoice.  ° °IF you received labwork today, you will receive an invoice from [facility]. Please contact [facility] at [phone] with questions or concerns regarding your invoice.  ° °Our billing staff will not be able to assist you with questions regarding bills from these companies. ° °You will be contacted with the lab results as soon as they are available. The fastest way to get your results is to activate your My Chart account. Instructions are located on the last page of this paperwork. If you have not heard from us regarding the results in 2 weeks, please contact this office. °  ° ° ° °

## 2019-11-03 NOTE — Progress Notes (Signed)
3/25/20218:16 AM  John Esparza 1965/12/25, 54 y.o., male [id number]  Chief Complaint  Patient presents with  . Hypertension    HPI:   Patient is a 54 y.o. male with past medical history significant for HTN, CAD, psoriathric arthritis, GAD who presents today for routine followup  Last OV dec 2020 - added low dose amlodipine at bedtime, trial of low dose crestor Rheum will be starting a different DMARD, recently has been on prednisone due to constant pain since being off consentyx Rheum had him back off on celebrex and ASA  Tolerating crestor well wo any side effects Has not been checking BP at home Takes atenolol 139m in AM  Has started working out and eating better His twin sister died 3 weeks ago, his dog died this week Works cont to be very stressful Takes buspar prn   Lab Results  Component Value Date   CREATININE 0.95 04/07/2019   BUN 13 04/07/2019   NA 138 04/07/2019   K 4.4 04/07/2019   CL 98 04/07/2019   CO2 24 04/07/2019   Lab Results  Component Value Date   CHOL 250 (H) 04/07/2019   HDL 49 04/07/2019   LDLCALC 137 (H) 04/07/2019   LDLDIRECT 118 (H) 06/22/2017   TRIG 320 (H) 04/07/2019   CHOLHDL 5.1 (H) 04/07/2019    Depression screen PHQ 2/9 11/03/2019 08/04/2019 07/14/2019  Decreased Interest 0 0 0  Down, Depressed, Hopeless 0 0 0  PHQ - 2 Score 0 0 0    Fall Risk  11/03/2019 08/04/2019 07/14/2019 06/27/2019 06/23/2019  Falls in the past year? 0 0 0 0 0  Number falls in past yr: 0 0 0 0 -  Injury with Fall? 0 0 0 0 -  Follow up Falls evaluation completed - - - Falls evaluation completed     Allergies  Allergen Reactions  . Demerol [Meperidine]     Irregular heartbeat   . Morphine And Related Itching    Prior to Admission medications   Medication Sig Start Date End Date Taking? Authorizing Provider  amLODipine (NORVASC) 2.5 MG tablet Take 1 tablet (2.5 mg total) by mouth daily. 08/04/19  Yes SRutherford Guys MD  aspirin 325 MG EC tablet  Take 325 mg by mouth daily.   Yes [provider]  atenolol (TENORMIN) 50 MG tablet Take 1 tablet (50 mg total) by mouth 2 (two) times daily. 05/06/19  Yes SRutherford Guys MD  busPIRone (BUSPAR) 7.5 MG tablet Take 1 tablet (7.5 mg total) by mouth 2 (two) times daily. 05/06/19  Yes SRutherford Guys MD  celecoxib (CELEBREX) 200 MG capsule Take 1 capsule (200 mg total) by mouth at bedtime. 08/04/19  Yes SRutherford Guys MD  clobetasol cream (TEMOVATE) 01.91% Apply 1 application topically 2 (two) times daily. 04/07/19  Yes SRutherford Guys MD  rosuvastatin (CRESTOR) 5 MG tablet Take 1 tablet (5 mg total) by mouth daily. 08/04/19  Yes SRutherford Guys MD  Secukinumab (COSENTYX) 150 MG/ML SOSY Inject 150 mg into the skin every 30 (thirty) days.    [provider]    Past Medical History:  Diagnosis Date  . Anxiety   . Back problem   . HTN (hypertension)   . Hyperlipidemia   . MYOCARDIAL INFARCTION, ACUTE, SUBENDOCARDIAL 09/10/2009   Qualifier: Diagnosis of  By: FOrville GovernCMA, Carol    . NSTEMI (non-ST elevated myocardial infarction) ([place] 1.1.2011   with two overlapping drug eluting stents mid lad  .  Psoriasis   . Psoriatic arthritis ([place]) 04/20/2017    Past Surgical History:  Procedure Laterality Date  . APPENDECTOMY    . disk repla     L5-S1  . RETINAL DETACHMENT SURGERY  age 89   traumatic    Social History   Tobacco Use  . Smoking status: Never Smoker  . Smokeless tobacco: Never Used  Substance Use Topics  . Alcohol use: Yes    Alcohol/week: 0.0 standard drinks    Comment: drinks 12 packs of beer a week    Family History  Problem Relation Age of Onset  . Hypertension Mother        with copd  . COPD Mother   . Heart disease Mother        AMI  . Heart attack Father   . Hypertension Father        with copd  . COPD Father   . Heart attack Brother        three brothers  . Heart disease Brother   . Lupus Sister        twin  . Cancer Sister         cervical cancer  . Coronary artery disease Brother        died following procedure  . Heart disease Brother   . Heart disease Brother     Review of Systems  Constitutional: Negative for chills and fever.  Respiratory: Negative for cough and shortness of breath.   Cardiovascular: Negative for chest pain, palpitations and leg swelling.  Gastrointestinal: Negative for abdominal pain, nausea and vomiting.     OBJECTIVE:  Today's Vitals   11/03/19 0811 11/03/19 0815  BP: (!) 160/104 (!) 130/94  Pulse: 69   Temp: 98.3 F (36.8 C)   SpO2: 98%   Weight: 260 lb (117.9 kg)   Height: 5' 11"  (1.803 m)    Body mass index is 36.26 kg/m.   Physical Exam Vitals and nursing note reviewed.  Constitutional:      Appearance: He is well-developed.  HENT:     Head: Normocephalic and atraumatic.  Eyes:     Conjunctiva/sclera: Conjunctivae normal.     Pupils: Pupils are equal, round, and reactive to light.  Cardiovascular:     Rate and Rhythm: Normal rate and regular rhythm.     Heart sounds: No murmur. No friction rub. No gallop.   Pulmonary:     Effort: Pulmonary effort is normal.     Breath sounds: Normal breath sounds. No wheezing, rhonchi or rales.  Musculoskeletal:     Cervical back: Neck supple.  Skin:    General: Skin is warm and dry.  Neurological:     Mental Status: He is alert and oriented to person, place, and time.     No results found for this or any previous visit (from the past 24 hour(s)).  No results found.   ASSESSMENT and PLAN  1. Pure hypercholesterolemia Checking labs today, medications will be adjusted as needed. LDL goal < 70 - Lipid panel - CMP14+EGFR  2. Essential hypertension Not at goal. Increase amlodipine to 50m at bedtime. - Lipid panel - CMP14+EGFR  3. Work-related stress Overall stable. Cont with buspar prn  4. Psoriatic arthritis ([place] Managed by rheum. Currently on pred. Plan is to start an new DMARD  Other orders - atenolol  (TENORMIN) 100 MG tablet; Take 1 tablet (100 mg total) by mouth daily. - amLODipine (NORVASC) 5 MG tablet; Take 1 tablet (5 mg total)  by mouth daily. - busPIRone (BUSPAR) 7.5 MG tablet; Take 1 tablet (7.5 mg total) by mouth 2 (two) times daily.  Return in about 3 months (around 02/03/2020).     Rutherford Guys, MD Primary Care at [facility] [place], [place] [address] Ph.  [phone] Fax [phone]

## 2019-11-04 LAB — CMP14+EGFR
ALT: 50 IU/L — ABNORMAL HIGH (ref 0–44)
AST: 35 IU/L (ref 0–40)
Albumin/Globulin Ratio: 1.3 (ref 1.2–2.2)
Albumin: 4.3 g/dL (ref 3.8–4.9)
Alkaline Phosphatase: 60 IU/L (ref 39–117)
BUN/Creatinine Ratio: 13 (ref 9–20)
BUN: 11 mg/dL (ref 6–24)
Bilirubin Total: 0.4 mg/dL (ref 0.0–1.2)
CO2: 22 mmol/L (ref 20–29)
Calcium: 9.1 mg/dL (ref 8.7–10.2)
Chloride: 100 mmol/L (ref 96–106)
Creatinine, Ser: 0.83 mg/dL (ref 0.76–1.27)
GFR calc Af Amer: 116 mL/min/{1.73_m2} (ref >59)
GFR calc non Af Amer: 100 mL/min/{1.73_m2} (ref >59)
Globulin, Total: 3.2 g/dL (ref 1.5–4.5)
Glucose: 122 mg/dL — ABNORMAL HIGH (ref 65–99)
Potassium: 4.1 mmol/L (ref 3.5–5.2)
Sodium: 137 mmol/L (ref 134–144)
Total Protein: 7.5 g/dL (ref 6.0–8.5)

## 2019-11-04 LAB — LIPID PANEL
Chol/HDL Ratio: 3.8 ratio (ref 0.0–5.0)
Cholesterol, Total: 171 mg/dL (ref 100–199)
HDL: 45 mg/dL (ref >39)
LDL Chol Calc (NIH): 83 mg/dL (ref 0–99)
Triglycerides: 264 mg/dL — ABNORMAL HIGH (ref 0–149)
VLDL Cholesterol Cal: 43 mg/dL — ABNORMAL HIGH (ref 5–40)

## 2019-11-24 ENCOUNTER — Telehealth: Payer: Self-pay | Admitting: Family Medicine

## 2019-11-24 NOTE — Progress Notes (Signed)
Pt had no follow up questions at this time

## 2019-11-24 NOTE — Telephone Encounter (Signed)
called [place] because he had a missd call from opatient ur office  No documentation

## 2019-12-20 ENCOUNTER — Encounter: Payer: Self-pay | Admitting: Emergency Medicine

## 2019-12-20 ENCOUNTER — Ambulatory Visit: Payer: 59 | Admitting: Emergency Medicine

## 2019-12-20 ENCOUNTER — Other Ambulatory Visit: Payer: Self-pay

## 2019-12-20 VITALS — BP 156/90 | HR 56 | Temp 97.5°F | Resp 16 | Ht 71.0 in | Wt 255.0 lb

## 2019-12-20 DIAGNOSIS — M62830 Muscle spasm of back: Secondary | ICD-10-CM | POA: Diagnosis not present

## 2019-12-20 DIAGNOSIS — M549 Dorsalgia, unspecified: Secondary | ICD-10-CM

## 2019-12-20 DIAGNOSIS — M778 Other enthesopathies, not elsewhere classified: Secondary | ICD-10-CM

## 2019-12-20 MED ORDER — CYCLOBENZAPRINE HCL 10 MG PO TABS: 10.0000 mg | ORAL_TABLET | Freq: Every day | ORAL | 0 refills | Status: DC

## 2019-12-20 MED ORDER — PREDNISONE 20 MG PO TABS: 20.0000 mg | ORAL_TABLET | Freq: Every day | ORAL | 0 refills | Status: AC

## 2019-12-20 NOTE — Patient Instructions (Addendum)
   If you have lab work done today you will be contacted with your lab results within the next 2 weeks.  If you have not heard from us then please contact us. The fastest way to get your results is to register for My Chart.   IF you received an x-ray today, you will receive an invoice from [facility]. Please contact [facility] at [phone] with questions or concerns regarding your invoice.   IF you received labwork today, you will receive an invoice from [facility]. Please contact [facility] at [phone] with questions or concerns regarding your invoice.   Our billing staff will not be able to assist you with questions regarding bills from these companies.  You will be contacted with the lab results as soon as they are available. The fastest way to get your results is to activate your My Chart account. Instructions are located on the last page of this paperwork. If you have not heard from us regarding the results in 2 weeks, please contact this office.     Acute Back Pain, Adult Acute back pain is sudden and usually short-lived. It is often caused by an injury to the muscles and tissues in the back. The injury may result from:  A muscle or ligament getting overstretched or torn (strained). Ligaments are tissues that connect bones to each other. Lifting something improperly can cause a back strain.  Wear and tear (degeneration) of the spinal disks. Spinal disks are circular tissue that provides cushioning between the bones of the spine (vertebrae).  Twisting motions, such as while playing sports or doing yard work.  A hit to the back.  Arthritis. You may have a physical exam, lab tests, and imaging tests to find the cause of your pain. Acute back pain usually goes away with rest and home care. Follow these instructions at home: Managing pain, stiffness, and swelling  Take over-the-counter and prescription medicines only as told by your health care  provider.  Your health care provider may recommend applying ice during the first 24-48 hours after your pain starts. To do this: ? Put ice in a plastic bag. ? Place a towel between your skin and the bag. ? Leave the ice on for 20 minutes, 2-3 times a day.  If directed, apply heat to the affected area as often as told by your health care provider. Use the heat source that your health care provider recommends, such as a moist heat pack or a heating pad. ? Place a towel between your skin and the heat source. ? Leave the heat on for 20-30 minutes. ? Remove the heat if your skin turns bright red. This is especially important if you are unable to feel pain, heat, or cold. You have a greater risk of getting burned. Activity   Do not stay in bed. Staying in bed for more than 1-2 days can delay your recovery.  Sit up and stand up straight. Avoid leaning forward when you sit, or hunching over when you stand. ? If you work at a desk, sit close to it so you do not need to lean over. Keep your chin tucked in. Keep your neck drawn back, and keep your elbows bent at a right angle. Your arms should look like the letter "L." ? Sit high and close to the steering wheel when you drive. Add lower back (lumbar) support to your car seat, if needed.  Take short walks on even surfaces as soon as you are able. Try   to increase the length of time you walk each day.  Do not sit, drive, or stand in one place for more than 30 minutes at a time. Sitting or standing for long periods of time can put stress on your back.  Do not drive or use heavy machinery while taking prescription pain medicine.  Use proper lifting techniques. When you bend and lift, use positions that put less stress on your back: ? Bend your knees. ? Keep the load close to your body. ? Avoid twisting.  Exercise regularly as told by your health care provider. Exercising helps your back heal faster and helps prevent back injuries by keeping muscles  strong and flexible.  Work with a physical therapist to make a safe exercise program, as recommended by your health care provider. Do any exercises as told by your physical therapist. Lifestyle  Maintain a healthy weight. Extra weight puts stress on your back and makes it difficult to have good posture.  Avoid activities or situations that make you feel anxious or stressed. Stress and anxiety increase muscle tension and can make back pain worse. Learn ways to manage anxiety and stress, such as through exercise. General instructions  Sleep on a firm mattress in a comfortable position. Try lying on your side with your knees slightly bent. If you lie on your back, put a pillow under your knees.  Follow your treatment plan as told by your health care provider. This may include: ? Cognitive or behavioral therapy. ? Acupuncture or massage therapy. ? Meditation or yoga. Contact a health care provider if:  You have pain that is not relieved with rest or medicine.  You have increasing pain going down into your legs or buttocks.  Your pain does not improve after 2 weeks.  You have pain at night.  You lose weight without trying.  You have a fever or chills. Get help right away if:  You develop new bowel or bladder control problems.  You have unusual weakness or numbness in your arms or legs.  You develop nausea or vomiting.  You develop abdominal pain.  You feel faint. Summary  Acute back pain is sudden and usually short-lived.  Use proper lifting techniques. When you bend and lift, use positions that put less stress on your back.  Take over-the-counter and prescription medicines and apply heat or ice as directed by your health care provider. This information is not intended to replace advice given to you by your health care provider. Make sure you discuss any questions you have with your health care provider. Document Revised: 11/16/2018 Document Reviewed: 03/11/2017 Elsevier  Patient Education  2020 Elsevier Inc.  

## 2019-12-20 NOTE — Progress Notes (Signed)
John Esparza 54 y.o.   Chief Complaint  Patient presents with  . Elbow Pain    RIGHT x 2 month  . Back Pain    spasm - per pt below belt area then become a knot    HISTORY OF PRESENT ILLNESS: This is a 54 y.o. male complaining of lumbar spasm for at least [redacted] week along with right elbow pain for about 2 months. 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  Lumbar pain is sharp and constant, mostly localized to left area with no sciatica symptoms. Right elbow pain is sharp and constant worse with activity, lifting in particular. Past medical history includes psoriatic arthritis.  Presently taking Humira. No other complaints or medical concerns today.  HPI   Prior to Admission medications   Medication Sig Start Date End Date Taking? Authorizing Provider  amLODipine (NORVASC) 5 MG tablet Take 1 tablet (5 mg total) by mouth daily. 11/03/19  Yes Myles Lipps, MD  aspirin 325 MG EC tablet Take 325 mg by mouth daily.   Yes [provider]  atenolol (TENORMIN) 100 MG tablet Take 1 tablet (100 mg total) by mouth daily. 11/03/19  Yes Myles Lipps, MD  busPIRone (BUSPAR) 7.5 MG tablet Take 1 tablet (7.5 mg total) by mouth 2 (two) times daily. 11/03/19  Yes Myles Lipps, MD  clobetasol cream (TEMOVATE) 0.05 % Apply 1 application topically 2 (two) times daily. 04/07/19  Yes Myles Lipps, MD  rosuvastatin (CRESTOR) 5 MG tablet Take 1 tablet (5 mg total) by mouth daily. 08/04/19  Yes Myles Lipps, MD  celecoxib (CELEBREX) 200 MG capsule Take 1 capsule (200 mg total) by mouth at bedtime. Patient not taking: Reported on 12/20/2019 08/04/19   Myles Lipps, MD    Allergies  Allergen Reactions  . Demerol [Meperidine]     Irregular heartbeat   . Morphine And Related Itching    Patient Active Problem List   Diagnosis Date Noted  . Family history of lupus erythematosus 09/04/2016  . Psoriatic arthritis (HCC) 08/20/2016  . Psoriasis 12/29/2015  . CAD, NATIVE VESSEL  09/21/2009  . Essential hypertension 09/10/2009  . NEPHROLITHIASIS, HX OF 09/10/2009    Past Medical History:  Diagnosis Date  . Anxiety   . Back problem   . HTN (hypertension)   . Hyperlipidemia   . MYOCARDIAL INFARCTION, ACUTE, SUBENDOCARDIAL 09/10/2009   Qualifier: Diagnosis of  By: Denyse Amass CMA, Carol    . NSTEMI (non-ST elevated myocardial infarction) (HCC) 1.1.2011   with two overlapping drug eluting stents mid lad  . Psoriasis   . Psoriatic arthritis (HCC) 04/20/2017    Past Surgical History:  Procedure Laterality Date  . APPENDECTOMY    . disk repla     L5-S1  . RETINAL DETACHMENT SURGERY  age 67   traumatic    Social History   Socioeconomic History  . Marital status: Single    Spouse name: Not on file  . Number of children: Not on file  . Years of education: Not on file  . Highest education level: Not on file  Occupational History  . Not on file  Tobacco Use  . Smoking status: Never Smoker  . Smokeless tobacco: Never Used  Substance and Sexual Activity  . Alcohol use: Yes    Alcohol/week: 0.0 standard drinks    Comment: drinks 12 packs of beer a week  . Drug use: No  . Sexual activity: Never  Other Topics Concern  . Not on  file  Social History Narrative   Marital status: single;       Children: 2 daughters; 1 grandson      Lives: with daughter, grandson      Employment:  Emergency planning/management officerolice officer for [employer]      Tobacco; none      Alcohol:  Weekends      Exercise:  daily   Social Determinants of Corporate investment bankerHealth   Financial Resource Strain:   . Difficulty of Paying Living Expenses:   Food Insecurity:   . Worried About Programme researcher, broadcasting/film/videounning Out of Food in the Last Year:   . Baristaan Out of Food in the Last Year:   Transportation Needs:   . Freight forwarderLack of Transportation (Medical):   Marland Kitchen. Lack of Transportation (Non-Medical):   Physical Activity:   . Days of Exercise per Week:   . Minutes of Exercise per Session:   Stress:   . Feeling of Stress :   Social  Connections:   . Frequency of Communication with Friends and Family:   . Frequency of Social Gatherings with Friends and Family:   . Attends Religious Services:   . Active Member of Clubs or Organizations:   . Attends BankerClub or Organization Meetings:   Marland Kitchen. Marital Status:   Intimate Partner Violence:   . Fear of Current or Ex-Partner:   . Emotionally Abused:   Marland Kitchen. Physically Abused:   . Sexually Abused:     Family History  Problem Relation Age of Onset  . Hypertension Mother        with copd  . COPD Mother   . Heart disease Mother        AMI  . Heart attack Father   . Hypertension Father        with copd  . COPD Father   . Heart attack Brother        three brothers  . Heart disease Brother   . Lupus Sister        twin  . Cancer Sister        cervical cancer  . Coronary artery disease Brother        died following procedure  . Heart disease Brother   . Heart disease Brother      Review of Systems  Constitutional: Negative.  Negative for chills and fever.  HENT: Negative.  Negative for congestion and sore throat.   Respiratory: Negative.  Negative for cough and shortness of breath.   Cardiovascular: Negative.  Negative for chest pain and palpitations.  Gastrointestinal: Negative for abdominal pain, blood in stool, diarrhea, melena, nausea and vomiting.  Genitourinary: Negative.  Negative for dysuria and hematuria.  Musculoskeletal: Positive for back pain and joint pain.  Skin: Negative.  Negative for rash.  Neurological: Negative for dizziness and headaches.  All other systems reviewed and are negative.  Today's Vitals   12/20/19 1139 12/20/19 1211  BP: (!) 183/98 (!) 156/90  Pulse: (!) 56   Resp: 16   Temp: (!) 97.5 F (36.4 C)   TempSrc: Temporal   SpO2: 96%   Weight: 255 lb (115.7 kg)   Height: 5\' 11"  (1.803 m)    Body mass index is 35.57 kg/m.   Physical Exam Vitals reviewed.  Constitutional:      Appearance: Normal appearance.  HENT:     Head:  Normocephalic.  Eyes:     Extraocular Movements: Extraocular movements intact.     Pupils: Pupils are equal, round, and reactive to light.  Cardiovascular:  Rate and Rhythm: Normal rate and regular rhythm.  Pulmonary:     Effort: Pulmonary effort is normal.  Musculoskeletal:     Cervical back: Normal range of motion and neck supple.     Lumbar back: Spasms and tenderness present. Decreased range of motion.     Comments: Right elbow: No erythema or ecchymosis.  Full range of motion.  Positive tenderness to flexor tendon at antecubital area.  Skin:    General: Skin is warm and dry.     Capillary Refill: Capillary refill takes less than 2 seconds.  Neurological:     General: No focal deficit present.     Mental Status: He is alert and oriented to person, place, and time.  Psychiatric:        Mood and Affect: Mood normal.        Behavior: Behavior normal.      ASSESSMENT & PLAN: Azarius was seen today for elbow pain and back pain.  Diagnoses and all orders for this visit:  Lumbar paraspinal muscle spasm -     cyclobenzaprine (FLEXERIL) 10 MG tablet; Take 1 tablet (10 mg total) by mouth at bedtime. -     Ambulatory referral to Chiropractic  Musculoskeletal back pain  Right elbow tendinitis -     predniSONE (DELTASONE) 20 MG tablet; Take 1 tablet (20 mg total) by mouth daily with breakfast for 5 days.    Patient Instructions       If you have lab work done today you will be contacted with your lab results within the next 2 weeks.  If you have not heard from [place] then please contact us. The fastest way to get your results is to register for My Chart.   IF you received an x-ray today, you will receive an invoice from [facility]. Please contact [facility] at [phone] with questions or concerns regarding your invoice.   IF you received labwork today, you will receive an invoice from [facility]. Please contact [facility] at [phone] with questions or  concerns regarding your invoice.   Our billing staff will not be able to assist you with questions regarding bills from these companies.  You will be contacted with the lab results as soon as they are available. The fastest way to get your results is to activate your My Chart account. Instructions are located on the last page of this paperwork. If you have not heard from [place] regarding the results in 2 weeks, please contact this office.     Acute Back Pain, Adult Acute back pain is sudden and usually short-lived. It is often caused by an injury to the muscles and tissues in the back. The injury may result from:  A muscle or ligament getting overstretched or torn (strained). Ligaments are tissues that connect bones to each other. Lifting something improperly can cause a back strain.  Wear and tear (degeneration) of the spinal disks. Spinal disks are circular tissue that provides cushioning between the bones of the spine (vertebrae).  Twisting motions, such as while playing sports or doing yard work.  A hit to the back.  Arthritis. You may have a physical exam, lab tests, and imaging tests to find the cause of your pain. Acute back pain usually goes away with rest and home care. Follow these instructions at home: Managing pain, stiffness, and swelling  Take over-the-counter and prescription medicines only as told by your health care provider.  Your health care provider may recommend applying ice during the first 24-48  hours after your pain starts. To do this: ? Put ice in a plastic bag. ? Place a towel between your skin and the bag. ? Leave the ice on for 20 minutes, 2-3 times a day.  If directed, apply heat to the affected area as often as told by your health care provider. Use the heat source that your health care provider recommends, such as a moist heat pack or a heating pad. ? Place a towel between your skin and the heat source. ? Leave the heat on for 20-30 minutes. ? Remove the  heat if your skin turns bright red. This is especially important if you are unable to feel pain, heat, or cold. You have a greater risk of getting burned. Activity   Do not stay in bed. Staying in bed for more than 1-2 days can delay your recovery.  Sit up and stand up straight. Avoid leaning forward when you sit, or hunching over when you stand. ? If you work at a desk, sit close to it so you do not need to lean over. Keep your chin tucked in. Keep your neck drawn back, and keep your elbows bent at a right angle. Your arms should look like the letter "L." ? Sit high and close to the steering wheel when you drive. Add lower back (lumbar) support to your car seat, if needed.  Take short walks on even surfaces as soon as you are able. Try to increase the length of time you walk each day.  Do not sit, drive, or stand in one place for more than 30 minutes at a time. Sitting or standing for long periods of time can put stress on your back.  Do not drive or use heavy machinery while taking prescription pain medicine.  Use proper lifting techniques. When you bend and lift, use positions that put less stress on your back: ? [place] your knees. ? Keep the load close to your body. ? Avoid twisting.  Exercise regularly as told by your health care provider. Exercising helps your back heal faster and helps prevent back injuries by keeping muscles strong and flexible.  Work with a physical therapist to make a safe exercise program, as recommended by your health care provider. Do any exercises as told by your physical therapist. Lifestyle  Maintain a healthy weight. Extra weight puts stress on your back and makes it difficult to have good posture.  Avoid activities or situations that make you feel anxious or stressed. Stress and anxiety increase muscle tension and can make back pain worse. Learn ways to manage anxiety and stress, such as through exercise. General instructions  Sleep on a firm mattress  in a comfortable position. Try lying on your side with your knees slightly bent. If you lie on your back, put a pillow under your knees.  Follow your treatment plan as told by your health care provider. This may include: ? Cognitive or behavioral therapy. ? Acupuncture or massage therapy. ? Meditation or yoga. Contact a health care provider if:  You have pain that is not relieved with rest or medicine.  You have increasing pain going down into your legs or buttocks.  Your pain does not improve after 2 weeks.  You have pain at night.  You lose weight without trying.  You have a fever or chills. Get help right away if:  You develop new bowel or bladder control problems.  You have unusual weakness or numbness in your arms or legs.  You develop  nausea or vomiting.  You develop abdominal pain.  You feel faint. Summary  Acute back pain is sudden and usually short-lived.  Use proper lifting techniques. When you bend and lift, use positions that put less stress on your back.  Take over-the-counter and prescription medicines and apply heat or ice as directed by your health care provider. This information is not intended to replace advice given to you by your health care provider. Make sure you discuss any questions you have with your health care provider. Document Revised: 11/16/2018 Document Reviewed: 03/11/2017 Elsevier Patient Education  2020 Elsevier Inc.      Edwina Barth, MD [facility]

## 2019-12-26 ENCOUNTER — Ambulatory Visit: Payer: Self-pay | Admitting: *Deleted

## 2019-12-26 ENCOUNTER — Telehealth: Payer: Self-pay

## 2019-12-26 NOTE — Telephone Encounter (Signed)
Thanks. I agree with the plan.

## 2019-12-26 NOTE — Telephone Encounter (Signed)
Noted.  Fyi for you Dr. Alvy Bimler.

## 2019-12-26 NOTE — Telephone Encounter (Signed)
Spoke with patient regarding current back pain . He went to Urgent care yesterday and was given another prescription for Oxycodone and flexeril . He did not fill the oxycodone right now because he still has some left from pcp office. Patient is laying down today to keep pain managed.  He will go to ER tomorrow if he feels pain is not getting any better.

## 2019-12-26 NOTE — Telephone Encounter (Signed)
Patient seen on 12/20/19 by Dr. Alvy Bimler for Lumbar paraspinal muscle spasm. Treated with Flexeril 10 mg tabs. Calling today with more severe pain stating he can't even walk at this time due to the pain. He was seen over the weekend at [facility] and treated with pain medication. Pain radiates in all directions and is more constant now than last week. Advised ED for evaluation at this time. He will call [phone] now. Routing to clinic for pcp's fyi.

## 2019-12-27 ENCOUNTER — Other Ambulatory Visit: Payer: Self-pay

## 2019-12-27 ENCOUNTER — Encounter (HOSPITAL_BASED_OUTPATIENT_CLINIC_OR_DEPARTMENT_OTHER): Payer: Self-pay | Admitting: Emergency Medicine

## 2019-12-27 ENCOUNTER — Emergency Department (HOSPITAL_BASED_OUTPATIENT_CLINIC_OR_DEPARTMENT_OTHER)
Admission: EM | Admit: 2019-12-27 | Discharge: 2019-12-27 | Disposition: A | Discharge status: Home / Self Care | Payer: 59 | Attending: Emergency Medicine | Admitting: Emergency Medicine

## 2019-12-27 DIAGNOSIS — Z79899 Other long term (current) drug therapy: Secondary | ICD-10-CM | POA: Insufficient documentation

## 2019-12-27 DIAGNOSIS — Z7982 Long term (current) use of aspirin: Secondary | ICD-10-CM | POA: Diagnosis not present

## 2019-12-27 DIAGNOSIS — Z885 Allergy status to narcotic agent status: Secondary | ICD-10-CM | POA: Insufficient documentation

## 2019-12-27 DIAGNOSIS — M545 Low back pain, unspecified: Secondary | ICD-10-CM

## 2019-12-27 DIAGNOSIS — I10 Essential (primary) hypertension: Secondary | ICD-10-CM | POA: Insufficient documentation

## 2019-12-27 DIAGNOSIS — E785 Hyperlipidemia, unspecified: Secondary | ICD-10-CM | POA: Insufficient documentation

## 2019-12-27 DIAGNOSIS — I252 Old myocardial infarction: Secondary | ICD-10-CM | POA: Insufficient documentation

## 2019-12-27 DIAGNOSIS — I2510 Atherosclerotic heart disease of native coronary artery without angina pectoris: Secondary | ICD-10-CM | POA: Insufficient documentation

## 2019-12-27 DIAGNOSIS — M62830 Muscle spasm of back: Secondary | ICD-10-CM | POA: Diagnosis not present

## 2019-12-27 NOTE — ED Notes (Signed)
Pt discharged to home. Discharge instructions have been discussed with patient and/or family members. Pt verbally acknowledges understanding d/c instructions, and endorses comprehension to checkout at registration before leaving.  °

## 2019-12-27 NOTE — Discharge Instructions (Addendum)
We saw you in the ER for back pain.  Please take anti-inflammatory as prescribed. Please use the back exercises/stretching to strengthen the back muscles and loosen up the spasms. Also please ensure you use heat and cold therapy.  See your pcp or have them send you to a physical therapist for further evaluation.

## 2019-12-27 NOTE — ED Triage Notes (Signed)
Pt c/o lower back pain x 1 week after bending over to get something from under sink. Reports hx of chronic back pain. Recently treated for same, endorses weakness on left side

## 2019-12-29 NOTE — ED Provider Notes (Signed)
[facility] EMERGENCY DEPARTMENT Provider Note   CSN: [id number] Arrival date & time: 12/27/19  0749     History Chief Complaint  Patient presents with  . Back Pain    John Esparza is a 54 y.o. male.  HPI     54 year old male comes in a chief complaint of back pain.  Patient has history of CAD and Psoriatec arthritis.  He reports that he started having this current pain couple of weeks back.  He had bent over in the bathroom to pick something up and started having pain.  The pain has persisted since then, and gotten worse despite him taking over-the-counter anti-inflammatory medications, muscle relaxants and narcotic pain medicine.  Patient saw his PCP who had prescribed medications discussed.  No history of pain to this extent in the past.  Patient denies any associated weakness, numbness, tingling in the legs.  He also denies any urinary incontinence, urinary retention, bowel incontinence.  Patient's pain is worse with any kind of movement.  He has no abdominal pain.  He denies any heavy smoking or illicit substance abuse.  Past Medical History:  Diagnosis Date  . Anxiety   . Back problem   . HTN (hypertension)   . Hyperlipidemia   . MYOCARDIAL INFARCTION, ACUTE, SUBENDOCARDIAL 09/10/2009   Qualifier: Diagnosis of  By: Orville Govern CMA, Carol    . NSTEMI (non-ST elevated myocardial infarction) ([place]) 1.1.2011   with two overlapping drug eluting stents mid lad  . Psoriasis   . Psoriatic arthritis ([place]) 04/20/2017    Patient Active Problem List   Diagnosis Date Noted  . Family history of lupus erythematosus 09/04/2016  . Psoriatic arthritis ([place]) 08/20/2016  . Psoriasis 12/29/2015  . CAD, NATIVE VESSEL 09/21/2009  . Essential hypertension 09/10/2009  . NEPHROLITHIASIS, HX OF 09/10/2009    Past Surgical History:  Procedure Laterality Date  . APPENDECTOMY    . disk repla     L5-S1  . RETINAL DETACHMENT SURGERY  age 77   traumatic       Family History    Problem Relation Age of Onset  . Hypertension Mother        with copd  . COPD Mother   . Heart disease Mother        AMI  . Heart attack Father   . Hypertension Father        with copd  . COPD Father   . Heart attack Brother        three brothers  . Heart disease Brother   . Lupus Sister        twin  . Cancer Sister        cervical cancer  . Coronary artery disease Brother        died following procedure  . Heart disease Brother   . Heart disease Brother     Social History   Tobacco Use  . Smoking status: Never Smoker  . Smokeless tobacco: Never Used  Substance Use Topics  . Alcohol use: Yes    Alcohol/week: 0.0 standard drinks    Comment: drinks 12 packs of beer a week  . Drug use: No    Home Medications Prior to Admission medications   Medication Sig Start Date End Date Taking? Authorizing Provider  amLODipine (NORVASC) 5 MG tablet Take 1 tablet (5 mg total) by mouth daily. 11/03/19   Rutherford Guys, MD  aspirin 325 MG EC tablet Take 325 mg by mouth daily.    [provider]  atenolol (TENORMIN) 100 MG tablet Take 1 tablet (100 mg total) by mouth daily. 11/03/19   Myles Lipps, MD  busPIRone (BUSPAR) 7.5 MG tablet Take 1 tablet (7.5 mg total) by mouth 2 (two) times daily. 11/03/19   Myles Lipps, MD  celecoxib (CELEBREX) 200 MG capsule Take 1 capsule (200 mg total) by mouth at bedtime. Patient not taking: Reported on 12/20/2019 08/04/19   Myles Lipps, MD  clobetasol cream (TEMOVATE) 0.05 % Apply 1 application topically 2 (two) times daily. 04/07/19   Myles Lipps, MD  cyclobenzaprine (FLEXERIL) 10 MG tablet Take 1 tablet (10 mg total) by mouth at bedtime. 12/20/19   Georgina Quint, MD  HYDROcodone-acetaminophen (NORCO/VICODIN) 5-325 MG tablet Take 1 tablet by mouth every 6 (six) hours as needed. 12/25/19   [provider]  ketorolac (TORADOL) 10 MG tablet Take 10 mg by mouth every 6 (six) hours as needed. 12/25/19   [provider]  predniSONE (DELTASONE) 20 MG tablet TAKE 3 TABLETS BY MOUTH ONCE DAILY FOR 3 DAYS THEN 2 ONCE DAILY FOR 3 DAYS THEN 1 ONCE DAILY FOR 3 DAYS 12/25/19   [provider]  rosuvastatin (CRESTOR) 5 MG tablet Take 1 tablet (5 mg total) by mouth daily. 08/04/19   Myles Lipps, MD    Allergies    Demerol [meperidine] and Morphine and related  Review of Systems   Review of Systems  Constitutional: Positive for activity change.  Cardiovascular: Negative for chest pain.  Musculoskeletal: Positive for back pain.  Neurological: Negative for weakness and numbness.  All other systems reviewed and are negative.   Physical Exam Updated Vital Signs BP (!) 154/98 (BP Location: Right Arm)   Pulse 69   Temp 98 F (36.7 C) (Oral)   Resp 16   SpO2 100%   Physical Exam Vitals and nursing note reviewed.  Constitutional:      Appearance: He is well-developed.  HENT:     Head: Atraumatic.  Cardiovascular:     Rate and Rhythm: Normal rate.  Pulmonary:     Effort: Pulmonary effort is normal.  Musculoskeletal:     Cervical back: Neck supple.     Comments: Pt has tenderness over the lumbar spine, mostly over the paraspinal region on the left side No step offs, no erythema. Pt has 2+ patellar reflex bilaterally. Able to discriminate between sharp and dull. Able to ambulate  Skin:    General: Skin is warm.  Neurological:     Mental Status: He is alert and oriented to person, place, and time.     ED Results / Procedures / Treatments   Labs (all labs ordered are listed, but only abnormal results are displayed) Labs Reviewed - No data to display  EKG None  Radiology No results found.  Procedures Procedures (including critical care time)  Medications Ordered in ED Medications - No data to display  ED Course  I have reviewed the triage vital signs and the nursing notes.  Pertinent labs & imaging results that were available during my care of the patient were  reviewed by me and considered in my medical decision making (see chart for details).    MDM Rules/Calculators/A&P                      Patient comes in a chief complaint of back pain. He is having worsening back pain and has history of L5 disc repair. Patient has no red  flags concerning for cord compression or cauda equina.  On exam his tenderness is mostly paraspinal and worse with any kind of movement or activity.  It appears that there is element of severe spasms.  Patient is already taking pain medication and muscle relaxants without significant relief.  He was recently started on anti-inflammatory as well.  For now I do not think any emergent work-up is needed.  I have requested the patient continue with the conservative measures and follow-up with the physical therapistor spine surgeon as soon as possible given that his symptoms have now been present close to 2 weeks.  Patient is in agreement with this plan. Strict ER return precautions have been discussed, and patient is agreeing with the plan and is comfortable with the workup done and the recommendations from the ER.   Final Clinical Impression(s) / ED Diagnoses Final diagnoses:  Acute left-sided low back pain without sciatica  Muscle spasm of back    Rx / DC Orders ED Discharge Orders    None       Derwood Kaplan, MD 12/29/19 [phone]

## 2020-02-03 ENCOUNTER — Ambulatory Visit: Payer: 59 | Admitting: Family Medicine

## 2020-02-06 ENCOUNTER — Other Ambulatory Visit: Payer: Self-pay

## 2020-02-06 ENCOUNTER — Ambulatory Visit: Payer: 59 | Admitting: Family Medicine

## 2020-02-06 ENCOUNTER — Encounter: Payer: Self-pay | Admitting: Family Medicine

## 2020-02-06 VITALS — BP 140/88 | HR 75 | Temp 98.4°F | Ht 71.0 in | Wt 260.0 lb

## 2020-02-06 DIAGNOSIS — I10 Essential (primary) hypertension: Secondary | ICD-10-CM | POA: Diagnosis not present

## 2020-02-06 DIAGNOSIS — M62830 Muscle spasm of back: Secondary | ICD-10-CM

## 2020-02-06 DIAGNOSIS — L4050 Arthropathic psoriasis, unspecified: Secondary | ICD-10-CM | POA: Diagnosis not present

## 2020-02-06 DIAGNOSIS — E7800 Pure hypercholesterolemia, unspecified: Secondary | ICD-10-CM | POA: Diagnosis not present

## 2020-02-06 DIAGNOSIS — M79631 Pain in right forearm: Secondary | ICD-10-CM

## 2020-02-06 LAB — CMP14+EGFR
ALT: 54 IU/L — ABNORMAL HIGH (ref 0–44)
AST: 40 IU/L (ref 0–40)
Albumin/Globulin Ratio: 1.3 (ref 1.2–2.2)
Albumin: 4.1 g/dL (ref 3.8–4.9)
Alkaline Phosphatase: 76 IU/L (ref 48–121)
BUN/Creatinine Ratio: 14 (ref 9–20)
BUN: 13 mg/dL (ref 6–24)
Bilirubin Total: 0.3 mg/dL (ref 0.0–1.2)
CO2: 19 mmol/L — ABNORMAL LOW (ref 20–29)
Calcium: 8.9 mg/dL (ref 8.7–10.2)
Chloride: 102 mmol/L (ref 96–106)
Creatinine, Ser: 0.9 mg/dL (ref 0.76–1.27)
GFR calc Af Amer: 112 mL/min/{1.73_m2} (ref >59)
GFR calc non Af Amer: 97 mL/min/{1.73_m2} (ref >59)
Globulin, Total: 3.2 g/dL (ref 1.5–4.5)
Glucose: 168 mg/dL — ABNORMAL HIGH (ref 65–99)
Potassium: 4.1 mmol/L (ref 3.5–5.2)
Sodium: 137 mmol/L (ref 134–144)
Total Protein: 7.3 g/dL (ref 6.0–8.5)

## 2020-02-06 LAB — LIPID PANEL
Chol/HDL Ratio: 8 ratio — ABNORMAL HIGH (ref 0.0–5.0)
Cholesterol, Total: 239 mg/dL — ABNORMAL HIGH (ref 100–199)
HDL: 30 mg/dL — ABNORMAL LOW (ref >39)
LDL Chol Calc (NIH): 108 mg/dL — ABNORMAL HIGH (ref 0–99)
Triglycerides: 585 mg/dL (ref 0–149)
VLDL Cholesterol Cal: 101 mg/dL — ABNORMAL HIGH (ref 5–40)

## 2020-02-06 MED ORDER — CYCLOBENZAPRINE HCL 10 MG PO TABS: 10.0000 mg | ORAL_TABLET | Freq: Every day | ORAL | 0 refills | Status: DC

## 2020-02-06 NOTE — Patient Instructions (Signed)
° ° ° °  If you have lab work done today you will be contacted with your lab results within the next 2 weeks.  If you have not heard from us then please contact us. The fastest way to get your results is to register for My Chart. ° ° °IF you received an x-ray today, you will receive an invoice from [facility]. Please contact [facility] at [phone] with questions or concerns regarding your invoice.  ° °IF you received labwork today, you will receive an invoice from [facility]. Please contact [facility] at [phone] with questions or concerns regarding your invoice.  ° °Our billing staff will not be able to assist you with questions regarding bills from these companies. ° °You will be contacted with the lab results as soon as they are available. The fastest way to get your results is to activate your My Chart account. Instructions are located on the last page of this paperwork. If you have not heard from us regarding the results in 2 weeks, please contact this office. °  ° ° ° °

## 2020-02-06 NOTE — Progress Notes (Signed)
6/28/20219:29 AM  John Esparza Dec 18, 1965, 54 y.o., male [id number]  Chief Complaint  Patient presents with  . Follow-up    having joint pain, taking flexeril and humira inj weekly. Having lots of pain. Arthritis doctor say do not take the celbrex due to kidney func    HPI:   Patient is a 54 y.o. male with past medical history significant for HTN, CAD, psoriathric arthritis, GADwho presents today forroutine followup  Last OV march 2021 - started amlodipine 52m daily, tolerating well  Rheum started him on humira, having less duration of flareups, next appt in sept Was taking either celebrex or aleve, but has not been taking either of recent due to concerns with kidney function, tends to do better with aleve Takes flexeril at bedtime prn for low back pain Chronic pain getting worse, very stiff painful elbows, hips, low back, wakes up very sore and stiff, cant even bend over Moans most nights, takes him over an hour to get ready Has not been able to date due to pain He has not been taking crestor He is also having pain on inside of right elbow, worse when he flexes or supinates, has been trying RICE therapy wo much benefit, denies any inciting event/trauma  Lab Results  Component Value Date   CREATININE 0.83 11/03/2019   BUN 11 11/03/2019   NA 137 11/03/2019   K 4.1 11/03/2019   CL 100 11/03/2019   CO2 22 11/03/2019   Lab Results  Component Value Date   CHOL 171 11/03/2019   HDL 45 11/03/2019   LDLCALC 83 11/03/2019   LDLDIRECT 118 (H) 06/22/2017   TRIG 264 (H) 11/03/2019   CHOLHDL 3.8 11/03/2019    Depression screen PHQ 2/9 12/20/2019 11/03/2019 08/04/2019  Decreased Interest 0 0 0  Down, Depressed, Hopeless 0 0 0  PHQ - 2 Score 0 0 0  Altered sleeping 2 - -  Tired, decreased energy 0 - -  Change in appetite 0 - -  Feeling bad or failure about yourself  0 - -  Trouble concentrating 0 - -  Moving slowly or fidgety/restless 0 - -  Suicidal thoughts 0 - -  PHQ-9  Score 2 - -    Fall Risk  02/06/2020 12/20/2019 11/03/2019 08/04/2019 07/14/2019  Falls in the past year? 0 0 0 0 0  Number falls in past yr: 0 - 0 0 0  Injury with Fall? 0 - 0 0 0  Follow up - Falls evaluation completed Falls evaluation completed - -     Allergies  Allergen Reactions  . Demerol [Meperidine]     Irregular heartbeat   . Morphine And Related Itching    Prior to Admission medications   Medication Sig Start Date End Date Taking? Authorizing Provider  amLODipine (NORVASC) 5 MG tablet Take 1 tablet (5 mg total) by mouth daily. 11/03/19  Yes SRutherford Guys MD  aspirin 325 MG EC tablet Take 325 mg by mouth daily.   Yes [provider]  atenolol (TENORMIN) 100 MG tablet Take 1 tablet (100 mg total) by mouth daily. 11/03/19  Yes SRutherford Guys MD  busPIRone (BUSPAR) 7.5 MG tablet Take 1 tablet (7.5 mg total) by mouth 2 (two) times daily. 11/03/19  Yes SRutherford Guys MD  celecoxib (CELEBREX) 200 MG capsule Take 1 capsule (200 mg total) by mouth at bedtime. 08/04/19  Yes SRutherford Guys MD  clobetasol cream (TEMOVATE) 00.03% Apply 1 application topically 2 (two) times  daily. 04/07/19  Yes Rutherford Guys, MD  HUMIRA PEN 40 MG/0.8ML PNKT Inject into the skin. 01/26/20   [provider]  rosuvastatin (CRESTOR) 5 MG tablet Take 1 tablet (5 mg total) by mouth daily. Patient not taking: Reported on 02/06/2020 08/04/19   Rutherford Guys, MD    Past Medical History:  Diagnosis Date  . Anxiety   . Back problem   . HTN (hypertension)   . Hyperlipidemia   . MYOCARDIAL INFARCTION, ACUTE, SUBENDOCARDIAL 09/10/2009   Qualifier: Diagnosis of  By: Orville Govern CMA, Carol    . NSTEMI (non-ST elevated myocardial infarction) ([place]) 1.1.2011   with two overlapping drug eluting stents mid lad  . Psoriasis   . Psoriatic arthritis ([place]) 04/20/2017    Past Surgical History:  Procedure Laterality Date  . APPENDECTOMY    . disk repla     L5-S1  . RETINAL DETACHMENT SURGERY   age 49   traumatic    Social History   Tobacco Use  . Smoking status: Never Smoker  . Smokeless tobacco: Never Used  Substance Use Topics  . Alcohol use: Yes    Alcohol/week: 0.0 standard drinks    Comment: drinks 12 packs of beer a week    Family History  Problem Relation Age of Onset  . Hypertension Mother        with copd  . COPD Mother   . Heart disease Mother        AMI  . Heart attack Father   . Hypertension Father        with copd  . COPD Father   . Heart attack Brother        three brothers  . Heart disease Brother   . Lupus Sister        twin  . Cancer Sister        cervical cancer  . Coronary artery disease Brother        died following procedure  . Heart disease Brother   . Heart disease Brother     Review of Systems  Constitutional: Negative for chills and fever.  Respiratory: Negative for cough and shortness of breath.   Cardiovascular: Negative for chest pain, palpitations and leg swelling.  Gastrointestinal: Negative for abdominal pain, nausea and vomiting.   Per hpi  OBJECTIVE:  Today's Vitals   02/06/20 0923  BP: 140/88  Pulse: 75  Temp: 98.4 F (36.9 C)  SpO2: 97%  Weight: 260 lb (117.9 kg)  Height: 5' 11"  (1.803 m)   Body mass index is 36.26 kg/m.   BP Readings from Last 3 Encounters:  02/06/20 140/88  12/27/19 (!) 154/98  12/20/19 (!) 156/90     Physical Exam Vitals and nursing note reviewed.  Constitutional:      Appearance: He is well-developed.  HENT:     Head: Normocephalic and atraumatic.  Eyes:     Conjunctiva/sclera: Conjunctivae normal.     Pupils: Pupils are equal, round, and reactive to light.  Cardiovascular:     Rate and Rhythm: Normal rate and regular rhythm.     Heart sounds: No murmur heard.  No friction rub. No gallop.   Pulmonary:     Effort: Pulmonary effort is normal.     Breath sounds: Normal breath sounds. No wheezing or rales.  Musculoskeletal:     Cervical back: Neck supple.  Skin:     General: Skin is warm and dry.  Neurological:     Mental Status: He is alert  and oriented to person, place, and time.     No results found for this or any previous visit (from the past 24 hour(s)).  No results found.   ASSESSMENT and PLAN  1. Essential hypertension Controlled. Continue current regime.  - Lipid panel - CMP14+EGFR  2. Psoriatic arthritis ([place]) Managed by rheum, unsure if current symptoms related to this or if he also has underlying MSK causes. Will reach out to rheum to further clarify.   3. Pure hypercholesterolemia Stopped crestor due to unclear reasons, labs pending. Will resume medication if needed. - Lipid panel - CMP14+EGFR  4. Lumbar paraspinal muscle spasm - cyclobenzaprine (FLEXERIL) 10 MG tablet; Take 1 tablet (10 mg total) by mouth at bedtime.  5. Right forearm pain - Ambulatory referral to Sports Medicine  Other orders   Return in about 3 months (around 05/08/2020).    Rutherford Guys, MD [facility] at [facility] [place], [place] [address] Ph.  [phone] Fax [phone]

## 2020-02-15 ENCOUNTER — Encounter: Payer: Self-pay | Admitting: Family Medicine

## 2020-02-15 ENCOUNTER — Other Ambulatory Visit: Payer: Self-pay

## 2020-02-15 ENCOUNTER — Ambulatory Visit: Payer: 59 | Admitting: Family Medicine

## 2020-02-15 VITALS — BP 142/88 | Ht 71.0 in | Wt 260.0 lb

## 2020-02-15 DIAGNOSIS — M25521 Pain in right elbow: Secondary | ICD-10-CM

## 2020-02-15 NOTE — Progress Notes (Signed)
PCP: Myles Lipps, MD  Subjective:   HPI: Patient is a 54 y.o. male here for right arm pain.  Patient reports about 2.5 months ago he started to get pain anterior right elbow. No acute injury or trauma. 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 No swelling or bruising. Right handed. No prior issues with this upper arm. No numbness.  Past Medical History:  Diagnosis Date  . Anxiety   . Back problem   . HTN (hypertension)   . Hyperlipidemia   . MYOCARDIAL INFARCTION, ACUTE, SUBENDOCARDIAL 09/10/2009   Qualifier: Diagnosis of  By: Denyse Amass CMA, Carol    . NSTEMI (non-ST elevated myocardial infarction) ([facility]) 1.1.2011   with two overlapping drug eluting stents mid lad  . Psoriasis   . Psoriatic arthritis ([facility]) 04/20/2017    Current Outpatient Medications on File Prior to Visit  Medication Sig Dispense Refill  . amLODipine (NORVASC) 5 MG tablet Take 1 tablet (5 mg total) by mouth daily. 90 tablet 1  . aspirin 325 MG EC tablet Take 325 mg by mouth daily.    Marland Kitchen atenolol (TENORMIN) 100 MG tablet Take 1 tablet (100 mg total) by mouth daily. 90 tablet 1  . busPIRone (BUSPAR) 7.5 MG tablet Take 1 tablet (7.5 mg total) by mouth 2 (two) times daily. 60 tablet 5  . celecoxib (CELEBREX) 200 MG capsule Take 1 capsule (200 mg total) by mouth at bedtime. 90 capsule 1  . clobetasol cream (TEMOVATE) 0.05 % Apply 1 application topically 2 (two) times daily. 30 g 11  . cyclobenzaprine (FLEXERIL) 10 MG tablet Take 1 tablet (10 mg total) by mouth at bedtime. 30 tablet 0  . HUMIRA PEN 40 MG/0.8ML PNKT Inject into the skin.    . rosuvastatin (CRESTOR) 5 MG tablet Take 1 tablet (5 mg total) by mouth daily. (Patient not taking: Reported on 02/06/2020) 90 tablet 0   No current facility-administered medications on file prior to visit.    Past Surgical History:  Procedure Laterality Date  . APPENDECTOMY    . disk repla     L5-S1  . RETINAL DETACHMENT  SURGERY  age 79   traumatic    Allergies  Allergen Reactions  . Demerol [Meperidine]     Irregular heartbeat   . Morphine And Related Itching    Social History   Socioeconomic History  . Marital status: Single    Spouse name: Not on file  . Number of children: Not on file  . Years of education: Not on file  . Highest education level: Not on file  Occupational History  . Not on file  Tobacco Use  . Smoking status: Never Smoker  . Smokeless tobacco: Never Used  Vaping Use  . Vaping Use: Never used  Substance and Sexual Activity  . Alcohol use: Yes    Alcohol/week: 0.0 standard drinks    Comment: drinks 12 packs of beer a week  . Drug use: No  . Sexual activity: Never  Other Topics Concern  . Not on file  Social History Narrative   Marital status: single;       Children: 2 daughters; 1 grandson      Lives: with daughter, grandson      Employment:  Emergency planning/management officer for [employer]      Tobacco; none      Alcohol:  Weekends      Exercise:  daily   Social Determinants of  Health   Financial Resource Strain:   . Difficulty of Paying Living Expenses:   Food Insecurity:   . Worried About Programme researcher, broadcasting/film/video in the Last Year:   . Barista in the Last Year:   Transportation Needs:   . Freight forwarder (Medical):   Marland Kitchen Lack of Transportation (Non-Medical):   Physical Activity:   . Days of Exercise per Week:   . Minutes of Exercise per Session:   Stress:   . Feeling of Stress :   Social Connections:   . Frequency of Communication with Friends and Family:   . Frequency of Social Gatherings with Friends and Family:   . Attends Religious Services:   . Active Member of Clubs or Organizations:   . Attends Banker Meetings:   Marland Kitchen Marital Status:   Intimate Partner Violence:   . Fear of Current or Ex-Partner:   . Emotionally Abused:   Marland Kitchen Physically Abused:   . Sexually Abused:     Family History  Problem Relation Age of Onset   . Hypertension Mother        with copd  . COPD Mother   . Heart disease Mother        AMI  . Heart attack Father   . Hypertension Father        with copd  . COPD Father   . Heart attack Brother        three brothers  . Heart disease Brother   . Lupus Sister        twin  . Cancer Sister        cervical cancer  . Coronary artery disease Brother        died following procedure  . Heart disease Brother   . Heart disease Brother     BP (!) 142/88   Ht 5\' 11"  (1.803 m)   Wt 260 lb (117.9 kg)   BMI 36.26 kg/m   Review of Systems: See HPI above.     Objective:  Physical Exam:  Gen: NAD, comfortable in exam room  Right elbow: No deformity, swelling, bruising. FROM with 5/5 strength but pain with resisted elbow flexion and supination. Mild tenderness to palpation distal biceps tendon.  No other tenderness. Collateral ligaments intact. NVI distally. Negative hook test.   MSK u/s right elbow:  Distal biceps tendon intact through to insertion.  No surrounding edema.  Assessment & Plan:  1. Right elbow pain - 2/2 distal biceps tendinopathy.  Ultrasound reassuring - no evidence partial tear.  Start physical therapy, heat.  Celebrex or aleve.  Consider compression sleeve.  Consider nitro patches if not improving.  F/u in 6 weeks.

## 2020-02-15 NOTE — Patient Instructions (Signed)
You have distal biceps tendinopathy. Start physical therapy and do home exercises on days you don't go to therapy. Use pain as your guide for working out in the gym - pain should not exceed 3/10 on a scale of 1-10. Heat 15 minutes at a time 3-4 times a day. Use celebrex twice a day OR aleve 2 tabs twice a day. A compression sleeve may be beneficial. We will consider nitro patches if not improving as expected. Follow up with me in 6 weeks.

## 2020-02-17 ENCOUNTER — Other Ambulatory Visit: Payer: Self-pay | Admitting: Family Medicine

## 2020-02-17 MED ORDER — ROSUVASTATIN CALCIUM 5 MG PO TABS: 5.0000 mg | ORAL_TABLET | Freq: Every day | ORAL | 1 refills | Status: DC

## 2020-02-20 ENCOUNTER — Other Ambulatory Visit: Payer: Self-pay | Admitting: Family Medicine

## 2020-02-20 DIAGNOSIS — M62830 Muscle spasm of back: Secondary | ICD-10-CM

## 2020-02-20 MED ORDER — CYCLOBENZAPRINE HCL 10 MG PO TABS: 10.0000 mg | ORAL_TABLET | Freq: Every day | ORAL | 0 refills | Status: DC

## 2020-02-20 NOTE — Telephone Encounter (Signed)
Is this RX too soon to be refilled?  Please advise.  Patient is requesting a refill of the following medications: Requested Prescriptions   Pending Prescriptions Disp Refills   cyclobenzaprine (FLEXERIL) 10 MG tablet 30 tablet 0    Sig: Take 1 tablet (10 mg total) by mouth at bedtime.    Date of patient request: 02/20/20 Last office visit: 02/06/20 Date of last refill: 02/06/20 Last refill amount: 30 +0  Follow up time period per chart: 05/07/20

## 2020-03-08 ENCOUNTER — Encounter: Payer: Self-pay | Admitting: Family Medicine

## 2020-03-11 MED ORDER — ROSUVASTATIN CALCIUM 5 MG PO TABS: 5.0000 mg | ORAL_TABLET | ORAL | 1 refills | Status: DC

## 2020-03-28 ENCOUNTER — Ambulatory Visit: Payer: 59 | Admitting: Family Medicine

## 2020-05-07 ENCOUNTER — Ambulatory Visit (INDEPENDENT_AMBULATORY_CARE_PROVIDER_SITE_OTHER): Payer: 59 | Admitting: Family Medicine

## 2020-05-07 ENCOUNTER — Other Ambulatory Visit: Payer: Self-pay

## 2020-05-07 ENCOUNTER — Encounter: Payer: Self-pay | Admitting: Family Medicine

## 2020-05-07 VITALS — BP 125/79 | HR 60 | Temp 98.1°F | Ht 71.0 in | Wt 260.0 lb

## 2020-05-07 DIAGNOSIS — F411 Generalized anxiety disorder: Secondary | ICD-10-CM | POA: Diagnosis not present

## 2020-05-07 DIAGNOSIS — E782 Mixed hyperlipidemia: Secondary | ICD-10-CM | POA: Diagnosis not present

## 2020-05-07 DIAGNOSIS — I10 Essential (primary) hypertension: Secondary | ICD-10-CM

## 2020-05-07 DIAGNOSIS — M62830 Muscle spasm of back: Secondary | ICD-10-CM

## 2020-05-07 MED ORDER — AMLODIPINE BESYLATE 5 MG PO TABS: 5.0000 mg | ORAL_TABLET | Freq: Every day | ORAL | 1 refills | Status: AC

## 2020-05-07 MED ORDER — ROSUVASTATIN CALCIUM 5 MG PO TABS: 5.0000 mg | ORAL_TABLET | Freq: Every day | ORAL | 1 refills | Status: DC

## 2020-05-07 MED ORDER — ATENOLOL 100 MG PO TABS: 100.0000 mg | ORAL_TABLET | Freq: Every day | ORAL | 1 refills | Status: DC

## 2020-05-07 MED ORDER — BUSPIRONE HCL 7.5 MG PO TABS: 7.5000 mg | ORAL_TABLET | Freq: Two times a day (BID) | ORAL | 5 refills | Status: AC

## 2020-05-07 MED ORDER — SERTRALINE HCL 50 MG PO TABS: 50.0000 mg | ORAL_TABLET | Freq: Every day | ORAL | 3 refills | Status: AC

## 2020-05-07 MED ORDER — METAXALONE 800 MG PO TABS: 800.0000 mg | ORAL_TABLET | Freq: Three times a day (TID) | ORAL | 5 refills | Status: DC

## 2020-05-07 MED ORDER — CYCLOBENZAPRINE HCL 10 MG PO TABS: 10.0000 mg | ORAL_TABLET | Freq: Every day | ORAL | 1 refills | Status: DC

## 2020-05-07 NOTE — Progress Notes (Signed)
9/27/20218:55 AM  John Esparza 04/04/1966, 54 y.o., male [id number]  Chief Complaint  Patient presents with  . lowere back pain    rx muscle relaxer  . Hypertension  . Hyperlipidemia    HPI:   Patient is a 53 y.o. male with past medical history significant for HTN, CAD, psoriathric arthritis, GADwho presents today forroutine followup  Last OV June 2021 -  Restarted crestor 10mg  daily, had to decrease to 5mg  qod due to palpitations Referred to sports medicine for right elbow pain -  Distal biceps tendinopathy, PT  He is overall doing well He is fasting today His low back pain is getting worse specially after long shifts Xray in 2019 showed DDD, has prosthetic disc He is having constant spasms, takes flexeril at bedtime - makes to sleepy to take during the day He does stretches and heat, takes aleve over celebrex He denies any pain radiating down his legs, changes to bowel or bladder function, focal weakness Has been able to increase crestor to 5mg  daily, he has also added fish oil BID His rheum changed home from humira to an oral medication He used to be on zoloft, having increasing panic attacks, recently panic attack lasted for 2 weeks  Really worried about dying on the job or one of colleagues dying on the job He denies SI  Wt Readings from Last 3 Encounters:  05/07/20 260 lb (117.9 kg)  02/15/20 260 lb (117.9 kg)  02/06/20 260 lb (117.9 kg)   BP Readings from Last 3 Encounters:  05/07/20 (!) 145/87  02/15/20 (!) 142/88  02/06/20 140/88   Lab Results  Component Value Date   CHOL 239 (H) 02/06/2020   HDL 30 (L) 02/06/2020   LDLCALC 108 (H) 02/06/2020   LDLDIRECT 118 (H) 06/22/2017   TRIG 585 (HH) 02/06/2020   CHOLHDL 8.0 (H) 02/06/2020   Lab Results  Component Value Date   CREATININE 0.90 02/06/2020   BUN 13 02/06/2020   NA 137 02/06/2020   K 4.1 02/06/2020   CL 102 02/06/2020   CO2 19 (L) 02/06/2020   Lab Results  Component Value Date   ALT 54 (H)  02/06/2020   AST 40 02/06/2020   ALKPHOS 76 02/06/2020   BILITOT 0.3 02/06/2020   Depression screen PHQ 2/9 05/07/2020 12/20/2019 11/03/2019  Decreased Interest 0 0 0  Down, Depressed, Hopeless 0 0 0  PHQ - 2 Score 0 0 0  Altered sleeping - 2 -  Tired, decreased energy - 0 -  Change in appetite - 0 -  Feeling bad or failure about yourself  - 0 -  Trouble concentrating - 0 -  Moving slowly or fidgety/restless - 0 -  Suicidal thoughts - 0 -  PHQ-9 Score - 2 -    Fall Risk  05/07/2020 02/06/2020 12/20/2019 11/03/2019 08/04/2019  Falls in the past year? 0 0 0 0 0  Number falls in past yr: 0 0 - 0 0  Injury with Fall? 0 0 - 0 0  Follow up Falls evaluation completed - Falls evaluation completed Falls evaluation completed -     Allergies  Allergen Reactions  . Demerol [Meperidine]     Irregular heartbeat   . Morphine And Related Itching    Prior to Admission medications   Medication Sig Start Date End Date Taking? Authorizing Provider  amLODipine (NORVASC) 5 MG tablet Take 1 tablet (5 mg total) by mouth daily. 11/03/19  Yes 11/05/2019, MD  aspirin 325 MG  EC tablet Take 325 mg by mouth daily.   Yes [provider]  atenolol (TENORMIN) 100 MG tablet Take 1 tablet (100 mg total) by mouth daily. 11/03/19  Yes Myles Lipps, MD  busPIRone (BUSPAR) 7.5 MG tablet Take 1 tablet (7.5 mg total) by mouth 2 (two) times daily. 11/03/19  Yes Myles Lipps, MD  celecoxib (CELEBREX) 200 MG capsule Take 1 capsule (200 mg total) by mouth at bedtime. 08/04/19  Yes Myles Lipps, MD  clobetasol cream (TEMOVATE) 0.05 % Apply 1 application topically 2 (two) times daily. 04/07/19  Yes Myles Lipps, MD  cyclobenzaprine (FLEXERIL) 10 MG tablet Take 1 tablet (10 mg total) by mouth at bedtime. 02/20/20  Yes Myles Lipps, MD  naproxen sodium (ALEVE) 220 MG tablet Take 220 mg by mouth.   Yes [provider]  rosuvastatin (CRESTOR) 5 MG tablet Take 1 tablet (5 mg total) by mouth  every other day. 03/11/20  Yes Myles Lipps, MD  HUMIRA PEN 40 MG/0.8ML PNKT Inject into the skin. 01/26/20   [provider]    Past Medical History:  Diagnosis Date  . Anxiety   . Back problem   . HTN (hypertension)   . Hyperlipidemia   . MYOCARDIAL INFARCTION, ACUTE, SUBENDOCARDIAL 09/10/2009   Qualifier: Diagnosis of  By: Denyse Amass CMA, Carol    . NSTEMI (non-ST elevated myocardial infarction) (HCC) 1.1.2011   with two overlapping drug eluting stents mid lad  . Psoriasis   . Psoriatic arthritis (HCC) 04/20/2017    Past Surgical History:  Procedure Laterality Date  . APPENDECTOMY    . disk repla     L5-S1  . RETINAL DETACHMENT SURGERY  age 61   traumatic    Social History   Tobacco Use  . Smoking status: Never Smoker  . Smokeless tobacco: Never Used  Substance Use Topics  . Alcohol use: Yes    Alcohol/week: 0.0 standard drinks    Comment: drinks 12 packs of beer a week    Family History  Problem Relation Age of Onset  . Hypertension Mother        with copd  . COPD Mother   . Heart disease Mother        AMI  . Heart attack Father   . Hypertension Father        with copd  . COPD Father   . Heart attack Brother        three brothers  . Heart disease Brother   . Lupus Sister        twin  . Cancer Sister        cervical cancer  . Coronary artery disease Brother        died following procedure  . Heart disease Brother   . Heart disease Brother     ROS Per hpi  OBJECTIVE:  Today's Vitals   05/07/20 0844 05/07/20 0903  BP: (!) 145/87 125/79  Pulse: 60   Temp: 98.1 F (36.7 C)   SpO2: 97%   Weight: 260 lb (117.9 kg)   Height: 5\' 11"  (1.803 m)    Body mass index is 36.26 kg/m.   Physical Exam Vitals and nursing note reviewed.  Constitutional:      Appearance: He is well-developed.  HENT:     Head: Normocephalic and atraumatic.  Eyes:     Conjunctiva/sclera: Conjunctivae normal.     Pupils: Pupils are equal, round, and reactive to  light.  Pulmonary:  Effort: Pulmonary effort is normal.  Musculoskeletal:     Cervical back: Neck supple.  Skin:    General: Skin is warm and dry.  Neurological:     Mental Status: He is alert and oriented to person, place, and time.     No results found for this or any previous visit (from the past 24 hour(s)).  No results found.   ASSESSMENT and PLAN  1. Essential hypertension Controlled. Continue current regime.  - Hemoglobin A1c  2. Mixed hyperlipidemia Checking labs today, medications will be adjusted as needed.  - Lipid panel - Comprehensive metabolic panel - Hemoglobin A1c  3. Lumbar paraspinal muscle spasm Worse. Trial of skelaxin during the day (should be less sedating), cont flexeril at bedtime. Reach out to spine surgeon, consider PT - cyclobenzaprine (FLEXERIL) 10 MG tablet; Take 1 tablet (10 mg total) by mouth at bedtime.  4. GAD (generalized anxiety disorder) Worse. Adding sertraline. Reviewed r/se/b. Cont buspar.   Other orders - naproxen sodium (ALEVE) 220 MG tablet; Take 220 mg by mouth. - metaxalone (SKELAXIN) 800 MG tablet; Take 1 tablet (800 mg total) by mouth 3 (three) times daily. - amLODipine (NORVASC) 5 MG tablet; Take 1 tablet (5 mg total) by mouth daily. - atenolol (TENORMIN) 100 MG tablet; Take 1 tablet (100 mg total) by mouth daily. - busPIRone (BUSPAR) 7.5 MG tablet; Take 1 tablet (7.5 mg total) by mouth 2 (two) times daily. - rosuvastatin (CRESTOR) 5 MG tablet; Take 1 tablet (5 mg total) by mouth daily. - sertraline (ZOLOFT) 50 MG tablet; Take 1 tablet (50 mg total) by mouth daily.  Return in about 4 weeks (around 06/04/2020) for Dr Alvy Bimler.    Myles Lipps, MD Primary Care at [facility] [address] [place], [place] [address] Ph.  [phone] Fax [phone]

## 2020-05-07 NOTE — Patient Instructions (Signed)
° ° ° °  If you have lab work done today you will be contacted with your lab results within the next 2 weeks.  If you have not heard from us then please contact us. The fastest way to get your results is to register for My Chart. ° ° °IF you received an x-ray today, you will receive an invoice from [facility]. Please contact [facility] at [phone] with questions or concerns regarding your invoice.  ° °IF you received labwork today, you will receive an invoice from [facility]. Please contact [facility] at [phone] with questions or concerns regarding your invoice.  ° °Our billing staff will not be able to assist you with questions regarding bills from these companies. ° °You will be contacted with the lab results as soon as they are available. The fastest way to get your results is to activate your My Chart account. Instructions are located on the last page of this paperwork. If you have not heard from us regarding the results in 2 weeks, please contact this office. °  ° ° ° °

## 2020-05-08 LAB — COMPREHENSIVE METABOLIC PANEL
ALT: 57 IU/L — ABNORMAL HIGH (ref 0–44)
AST: 39 IU/L (ref 0–40)
Albumin/Globulin Ratio: 1.4 (ref 1.2–2.2)
Albumin: 4.5 g/dL (ref 3.8–4.9)
Alkaline Phosphatase: 54 IU/L (ref 44–121)
BUN/Creatinine Ratio: 15 (ref 9–20)
BUN: 13 mg/dL (ref 6–24)
Bilirubin Total: 0.3 mg/dL (ref 0.0–1.2)
CO2: 24 mmol/L (ref 20–29)
Calcium: 9.3 mg/dL (ref 8.7–10.2)
Chloride: 102 mmol/L (ref 96–106)
Creatinine, Ser: 0.87 mg/dL (ref 0.76–1.27)
GFR calc Af Amer: 113 mL/min/{1.73_m2} (ref >59)
GFR calc non Af Amer: 98 mL/min/{1.73_m2} (ref >59)
Globulin, Total: 3.2 g/dL (ref 1.5–4.5)
Glucose: 113 mg/dL — ABNORMAL HIGH (ref 65–99)
Potassium: 4.4 mmol/L (ref 3.5–5.2)
Sodium: 140 mmol/L (ref 134–144)
Total Protein: 7.7 g/dL (ref 6.0–8.5)

## 2020-05-08 LAB — LIPID PANEL
Chol/HDL Ratio: 5.5 ratio — ABNORMAL HIGH (ref 0.0–5.0)
Cholesterol, Total: 236 mg/dL — ABNORMAL HIGH (ref 100–199)
HDL: 43 mg/dL (ref >39)
LDL Chol Calc (NIH): 123 mg/dL — ABNORMAL HIGH (ref 0–99)
Triglycerides: 393 mg/dL — ABNORMAL HIGH (ref 0–149)
VLDL Cholesterol Cal: 70 mg/dL — ABNORMAL HIGH (ref 5–40)

## 2020-05-08 LAB — HEMOGLOBIN A1C
Est. average glucose Bld gHb Est-mCnc: 120 mg/dL
Hgb A1c MFr Bld: 5.8 % — ABNORMAL HIGH (ref 4.8–5.6)

## 2020-05-17 ENCOUNTER — Telehealth: Payer: Self-pay | Admitting: Family Medicine

## 2020-05-17 NOTE — Telephone Encounter (Signed)
Pt is having extreme back pain and he would like something for his pain. He has an upcoming appointment with Kateri Plummer on 05/21/20. Pharmacy  [facility] [address] [place] - [address]  [address] [place] [address]  Phone:  [phone] Fax:  [phone]  DEA #:  [id number]   Please advise at [phone].

## 2020-05-17 NOTE — Telephone Encounter (Signed)
Pain medications will be prescribed at visit

## 2020-05-21 ENCOUNTER — Ambulatory Visit: Payer: 59 | Admitting: Registered Nurse

## 2020-05-22 ENCOUNTER — Other Ambulatory Visit: Payer: Self-pay | Admitting: Family Medicine

## 2020-05-22 MED ORDER — ROSUVASTATIN CALCIUM 10 MG PO TABS: 10.0000 mg | ORAL_TABLET | Freq: Every day | ORAL | 1 refills | Status: AC

## 2020-08-02 ENCOUNTER — Other Ambulatory Visit: Payer: Self-pay | Admitting: Orthopaedic Surgery

## 2020-08-02 ENCOUNTER — Telehealth: Payer: Self-pay

## 2020-08-02 DIAGNOSIS — M4726 Other spondylosis with radiculopathy, lumbar region: Secondary | ICD-10-CM

## 2020-08-02 NOTE — Telephone Encounter (Signed)
Phone call to patient to verify medication list and allergies for myelogram procedure. Pt instructed to hold Buspar and Zoloft for 48hrs prior to myelogram appointment time and 24 hours after appointment. Pt also instructed to have a driver the day of the procedure, the procedure would take around 2 hours, and discharge instructions discussed. Pt verbalized understanding.

## 2020-08-14 ENCOUNTER — Ambulatory Visit
Admission: RE | Admit: 2020-08-14 | Discharge: 2020-08-14 | Disposition: A | Discharge status: Home / Self Care | Payer: 59 | Source: Ambulatory Visit | Attending: Orthopaedic Surgery | Admitting: Orthopaedic Surgery

## 2020-08-14 ENCOUNTER — Other Ambulatory Visit: Payer: Self-pay

## 2020-08-14 DIAGNOSIS — M4726 Other spondylosis with radiculopathy, lumbar region: Secondary | ICD-10-CM

## 2020-08-14 MED ORDER — IOPAMIDOL (ISOVUE-M 200) INJECTION 41%
15.0000 mL | Freq: Once | INTRAMUSCULAR | Status: AC
  Administered 2020-08-14: 15 mL via INTRATHECAL

## 2020-08-14 MED ORDER — DIAZEPAM 5 MG PO TABS
10.0000 mg | ORAL_TABLET | Freq: Once | ORAL | Status: AC
  Administered 2020-08-14: 5 mg via ORAL

## 2020-08-14 NOTE — Discharge Instructions (Signed)

## 2020-08-14 NOTE — Progress Notes (Signed)
Pt reports he stopped his Zoloft for 48 hours but did not stop his Buspar for 48 hours. Pt reports he last took his Buspar last night before bed. Reported this to Dr. Charise Killian, which states we will proceed with the procedure. Pt aware to not take his Zoloft and Buspar for 24 hours after this procedure.

## 2021-01-31 IMAGING — XA DG MYELOGRAPHY LUMBAR INJ LUMBOSACRAL
6 of 20 series · 6 of 20 positions shown · non-contrast
Comparison: MRI lumbar spine dated June 11, 2020.

CLINICAL DATA: Chronic severe low back, bilateral hip, and leg pain
primarily when standing. History of prior L5-S1 disc replacement in
3888.
TECHNIQUE: Contiguous axial images were obtained through the lumbar spine after
the intrathecal infusion of contrast. Coronal and sagittal
reconstructions were obtained of the axial image sets.

[Series 1: ortho standard · 1 of 1 slices shown (1 of 3)]
[im 1/1]
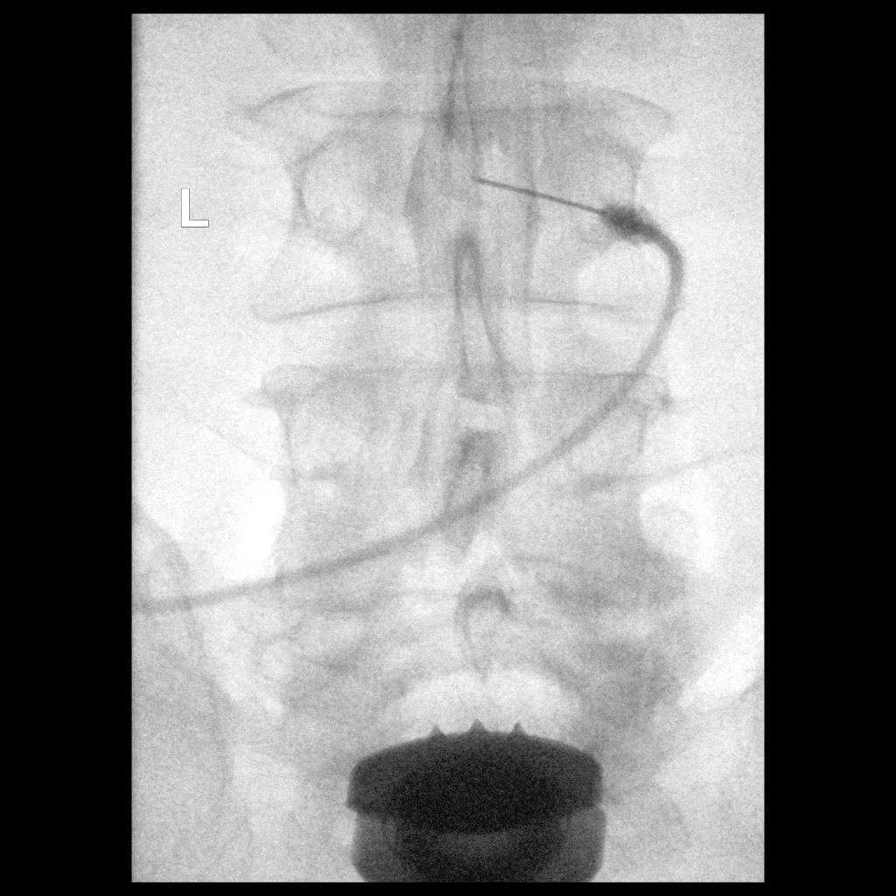

[Series 1: w lumbar spine lat · 0.15mm/px · 1 of 1 slices shown]
[im 1/1]
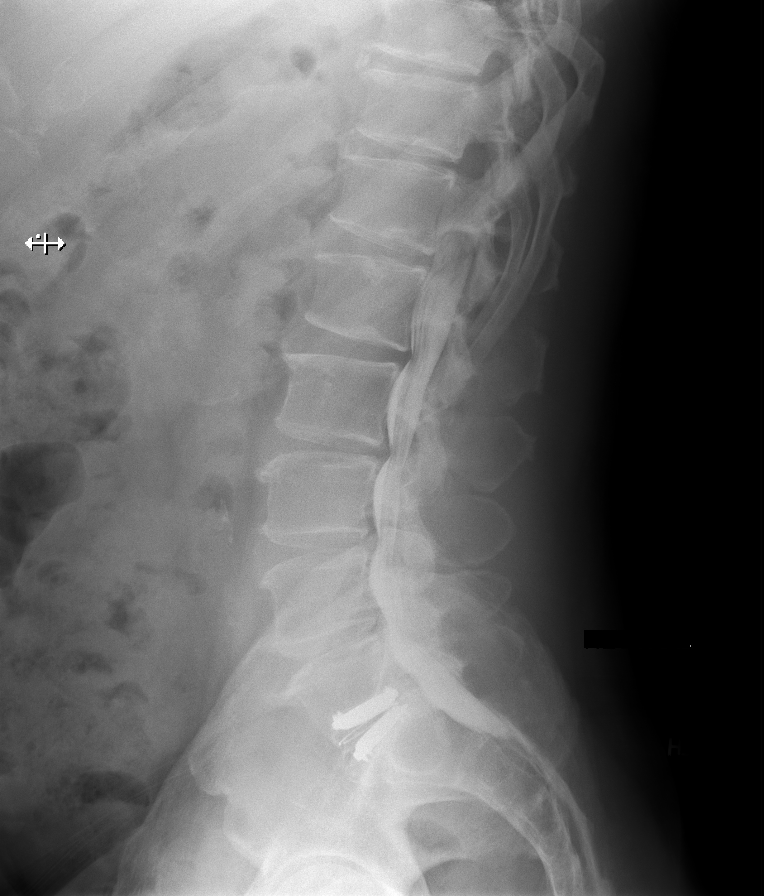

[Series 2: w lumbar spine flexion · 0.15mm/px · 1 of 1 slices shown]
[im 1/1]
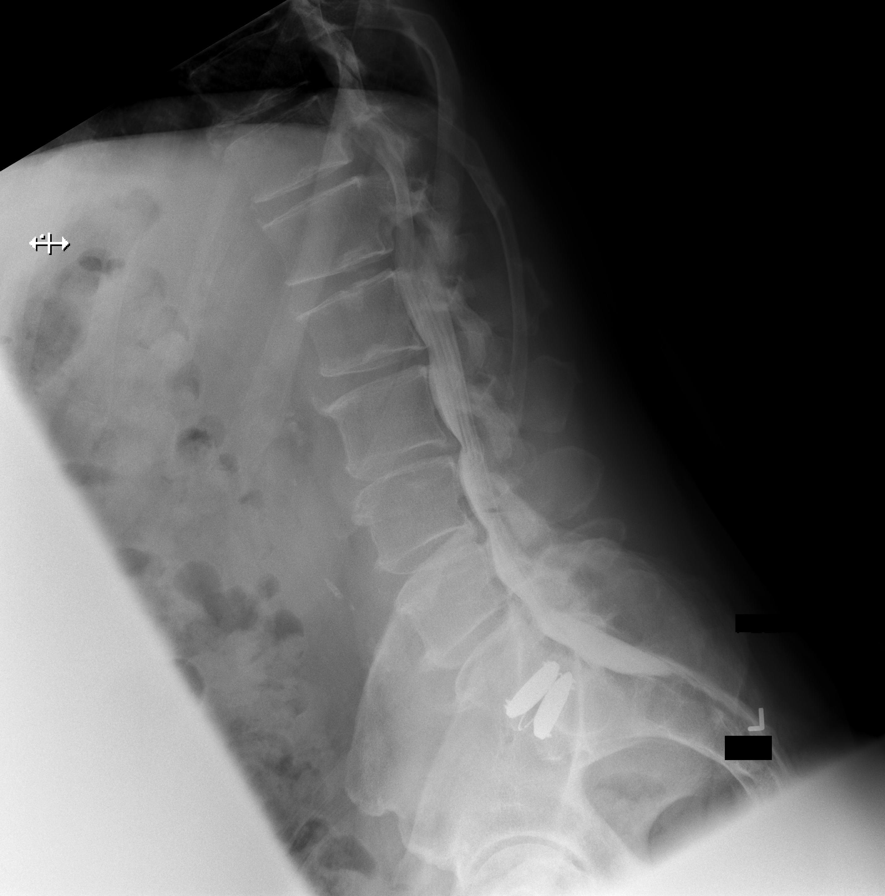

[Series 2: ortho standard · 1 of 1 slices shown (2 of 3)]
[im 1/1]
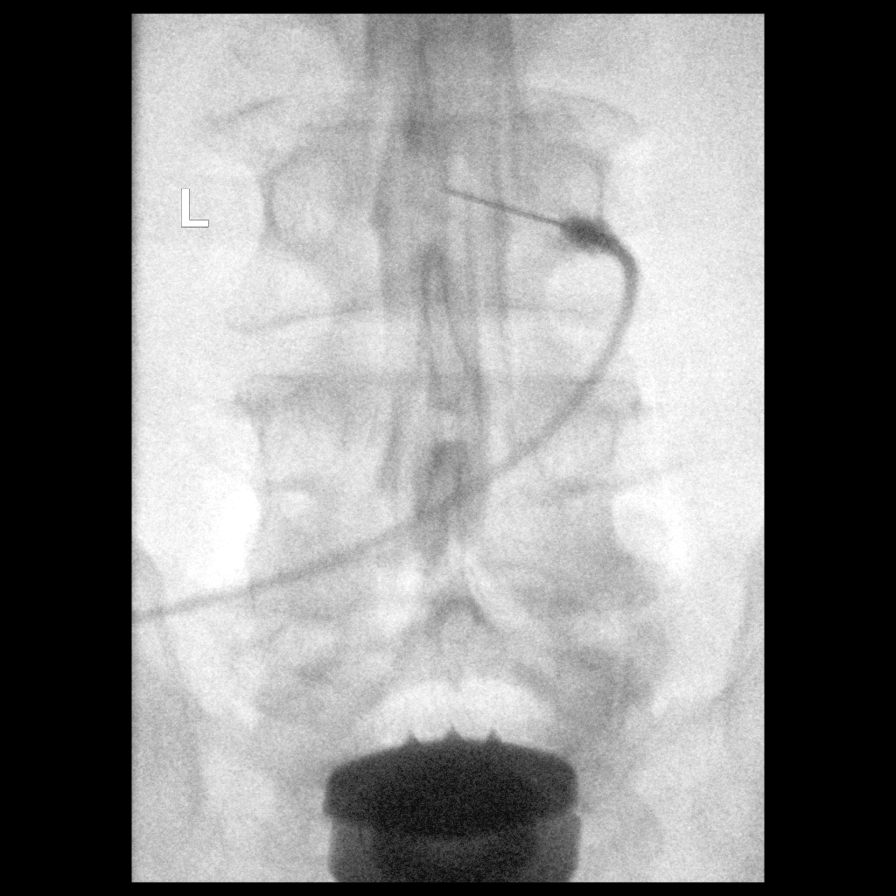

[Series 3: ortho standard · 1 of 1 slices shown (3 of 3)]
[im 1/1]
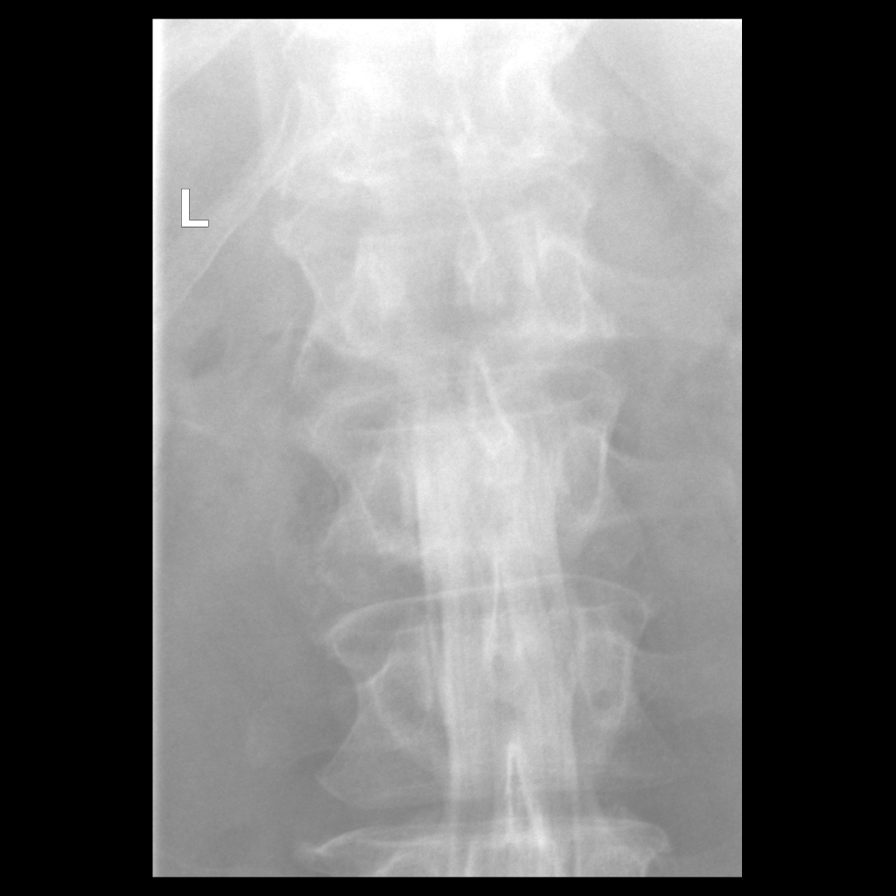

[Series 3: w lumbar spine extension · 0.15mm/px · 1 of 1 slices shown]
[im 1/1]
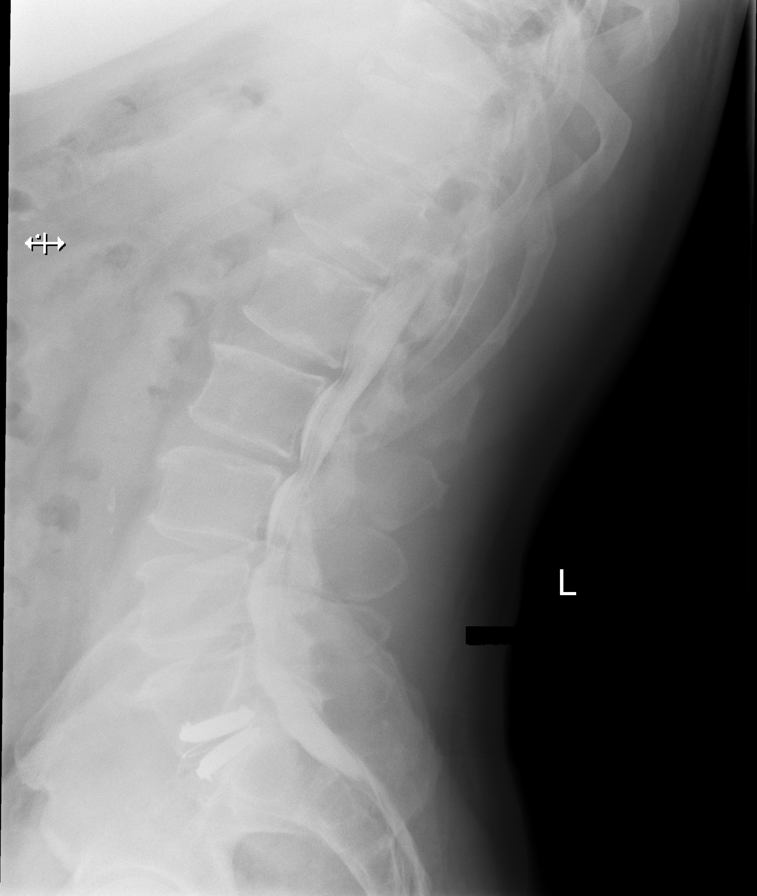

[6 of 20 positions shown; findings below may reference images not displayed]

EXAM:
LUMBAR MYELOGRAM

CT LUMBAR MYELOGRAM

FLUOROSCOPY TIME:  Radiation Exposure Index (as provided by the
fluoroscopic device): 21.1 mGy

Fluoroscopy Time:  51 seconds

Number of Acquired Images:  18

PROCEDURE:
After thorough discussion of risks and benefits of the procedure
including bleeding, infection, injury to nerves, blood vessels,
adjacent structures as well as headache and CSF leak, written and
oral informed consent was obtained. Consent was obtained by Dr.
Taam Gostavo. Time out form was completed.

Patient was positioned prone on the fluoroscopy table. Local
anesthesia was provided with 1% lidocaine without epinephrine after
prepped and draped in the usual sterile fashion. Puncture was
performed at L2-L3 using a 3 1/2 inch 22-gauge spinal needle via
right interlaminar approach. Using a single pass through the dura,
the needle was placed within the thecal sac, with return of clear
CSF. 15 mL of Isovue 3-KTT was injected into the thecal sac, with
normal opacification of the nerve roots and cauda equina consistent
with free flow within the subarachnoid space.

I personally performed the lumbar puncture and administered the
intrathecal contrast. I also personally supervised acquisition of
the myelogram images.
FINDINGS: LUMBAR MYELOGRAM FINDINGS:

Normal alignment. No dynamic instability. Prior L5-S1 disc
replacement. Small ventral extradural defects from L1-L2 through
L4-L5. Mild spinal canal and lateral recess stenosis at L4-L5 when
prone. This does not appreciably worsen with dynamic maneuvers.
There is mild spinal canal stenosis at L2-L3 and L3-L4 with standing
and extension, improved with flexion. Underfilling of the right L4
nerve root.

CT LUMBAR MYELOGRAM FINDINGS:

Segmentation: Standard.

Alignment: Physiologic.

Vertebrae: No acute fracture or other focal pathologic process.
Scattered small Schmorl's nodes.

Conus medullaris and cauda equina: Conus extends to the L1 level.
Conus and cauda equina appear normal.

Paraspinal and other soft tissues: Aortoiliac atherosclerotic
vascular disease. Retroaortic left renal vein.

Disc levels:

T12-L1:  No significant disc bulge or herniation.  No stenosis.

L1-L2:  Unchanged minimal disc bulging.  No stenosis.

L2-L3: Unchanged mild disc bulging with superimposed small left
foraminal disc protrusion. Unchanged mild spinal canal stenosis. No
neuroforaminal stenosis.

L3-L4: Unchanged mild disc bulging and bilateral facet arthropathy.
Unchanged mild spinal canal stenosis. No neuroforaminal stenosis.

L4-L5: Mild disc bulging with right far lateral disc osteophyte
complex. Moderate bilateral facet arthropathy. Mild spinal canal
stenosis. Moderate right and mild left lateral recess stenosis.
Moderate right neuroforaminal stenosis. Findings are unchanged.

L5-S1: Prior disc replacement. No evidence of hardware failure or
loosening. Unchanged mild bilateral facet arthropathy. No stenosis.
IMPRESSION: 1. Unchanged multilevel lumbar spondylosis as described above, with
mild spinal canal stenosis and moderate right lateral recess and
neuroforaminal stenosis at L4-L5.
2. Unchanged mild spinal canal stenosis at L2-L3 and L3-L4.
3. Prior L5-S1 disc replacement without hardware complication or
stenosis.
4. Aortic Atherosclerosis ([id number]).

## 2024-04-03 ENCOUNTER — Encounter (HOSPITAL_BASED_OUTPATIENT_CLINIC_OR_DEPARTMENT_OTHER): Payer: Self-pay

## 2024-04-03 ENCOUNTER — Ambulatory Visit (HOSPITAL_BASED_OUTPATIENT_CLINIC_OR_DEPARTMENT_OTHER): Admission: EM | Admit: 2024-04-03 | Discharge: 2024-04-03 | Disposition: A | Discharge status: Home / Self Care

## 2024-04-03 DIAGNOSIS — L259 Unspecified contact dermatitis, unspecified cause: Secondary | ICD-10-CM | POA: Diagnosis not present

## 2024-04-03 DIAGNOSIS — H1031 Unspecified acute conjunctivitis, right eye: Secondary | ICD-10-CM

## 2024-04-03 MED ORDER — MOXIFLOXACIN HCL 0.5 % OP SOLN: 1.0000 [drp] | Freq: Three times a day (TID) | OPHTHALMIC | 0 refills | Status: AC

## 2024-04-03 MED ORDER — TRIAMCINOLONE ACETONIDE 40 MG/ML IJ SUSP
80.0000 mg | Freq: Once | INTRAMUSCULAR | Status: AC
  Administered 2024-04-03: 80 mg via INTRAMUSCULAR

## 2024-04-03 NOTE — ED Triage Notes (Addendum)
 Poison ivy on right eye and left arm  Would like refill on atenolol 

## 2024-04-03 NOTE — Discharge Instructions (Signed)
 Contact dermatitis of right face, right scalp and left arm: Kenalog  80 mg injection now.  Kenalog  is a steroid.  Use cetirizine , 10 mg, nightly to help with the rash and any itching.  Conjunctivitis of right eye: Moxifloxacin  ophthalmic drops, 1 drop 3 times a day for 5 to 7 days.  Follow-up if symptoms do not improve, worsen or new symptoms occur.

## 2024-04-03 NOTE — ED Provider Notes (Incomplete)
 [facility]    CSN: [id number] Arrival date & time: 04/03/24  1543      History   Chief Complaint Chief Complaint  Patient presents with   Poison Ivy    HPI John Esparza is a 58 y.o. male.   58 year old male who was working outside in got into some poison ivy.  He had it on his gloves then he touched his right forehead right eye and right upper cheek.  He has quite a bit of swelling and whelps or wheals on his right forehead, right upper eyelid and lower eyelid and right cheek and he has some whelps and wheals with erythema in his left antecubital fossa.   Poison Ivy Pertinent negatives include no chest pain and no abdominal pain.    Past Medical History:  Diagnosis Date   Anxiety    Back problem    HTN (hypertension)    Hyperlipidemia    MYOCARDIAL INFARCTION, ACUTE, SUBENDOCARDIAL 09/10/2009   Qualifier: Diagnosis of  By: Wynetta, CMA, Carol     NSTEMI (non-ST elevated myocardial infarction) (HCC) 1.1.2011   with two overlapping drug eluting stents mid lad   Psoriasis    Psoriatic arthritis (HCC) 04/20/2017    Patient Active Problem List   Diagnosis Date Noted   Family history of lupus erythematosus 09/04/2016   Psoriatic arthritis (HCC) 08/20/2016   Psoriasis 12/29/2015   CAD, NATIVE VESSEL 09/21/2009   Essential hypertension 09/10/2009   NEPHROLITHIASIS, HX OF 09/10/2009    Past Surgical History:  Procedure Laterality Date   APPENDECTOMY     disk repla     L5-S1   RETINAL DETACHMENT SURGERY  age 6   traumatic       Home Medications    Prior to Admission medications   Medication Sig Start Date End Date Taking? Authorizing Provider  guselkumab [facility]) 100 MG/ML prefilled syringe as directed Subcutaneous week 0 and week 4 then every 8 weeks; Duration: 30 days 05/08/20  Yes [provider]  moxifloxacin  (VIGAMOX ) 0.5 % ophthalmic solution Place 1 drop into both eyes 3 (three) times daily. 04/03/24  Yes Ival Domino, FNP  amLODipine   (NORVASC ) 5 MG tablet Take 1 tablet (5 mg total) by mouth daily. 05/07/20   Melonie Tori Mikel CHRISTELLA, MD  aspirin 325 MG EC tablet Take 325 mg by mouth daily.    [provider]  atenolol  (TENORMIN ) 100 MG tablet Take 1 tablet (100 mg total) by mouth daily. 05/07/20   Melonie Tori, Mikel CHRISTELLA, MD  busPIRone  (BUSPAR ) 7.5 MG tablet Take 1 tablet (7.5 mg total) by mouth 2 (two) times daily. Patient taking differently: Take 7.5 mg by mouth 2 (two) times daily. Pt takes as needed 05/07/20   Melonie Tori, Mikel CHRISTELLA, MD  clobetasol  cream (TEMOVATE ) 0.05 % Apply 1 application topically 2 (two) times daily. 04/07/19   Melonie Tori Mikel CHRISTELLA, MD  gabapentin (NEURONTIN) 300 MG capsule Take 300 mg by mouth 2 (two) times daily.    [provider]  rosuvastatin  (CRESTOR ) 10 MG tablet Take 1 tablet (10 mg total) by mouth daily. 05/22/20   Melonie Tori Mikel CHRISTELLA, MD  sertraline  (ZOLOFT ) 50 MG tablet Take 1 tablet (50 mg total) by mouth daily. Patient taking differently: Take 50 mg by mouth daily. Pt taking as needed 05/07/20   Melonie Tori, Mikel CHRISTELLA, MD    Family History Family History  Problem Relation Age of Onset   Hypertension Mother  with copd   COPD Mother    Heart disease Mother        AMI   Heart attack Father    Hypertension Father        with copd   COPD Father    Heart attack Brother        three brothers   Heart disease Brother    Lupus Sister        twin   Cancer Sister        cervical cancer   Coronary artery disease Brother        died following procedure   Heart disease Brother    Heart disease Brother     Social History Social History   Tobacco Use   Smoking status: Never   Smokeless tobacco: Never  Vaping Use   Vaping status: Never Used  Substance Use Topics   Alcohol use: Yes    Alcohol/week: 0.0 standard drinks of alcohol    Comment: drinks 12 packs of beer a week   Drug use: No     Allergies   Demerol [meperidine] and Morphine and codeine   Review  of Systems Review of Systems  Constitutional:  Negative for chills and fever.  HENT:  Negative for ear pain and sore throat.   Eyes:  Positive for discharge (Right eye -watery discharge) and redness (Right eye). Negative for pain and visual disturbance.  Respiratory:  Negative for cough.   Cardiovascular:  Negative for chest pain and palpitations.  Gastrointestinal:  Negative for abdominal pain, constipation, diarrhea, nausea and vomiting.  Genitourinary:  Negative for dysuria and hematuria.  Musculoskeletal:  Negative for arthralgias and back pain.  Skin:  Positive for rash (Erythema with whelps and wheals on left antecubital fossa and right forehead, right cheek and right upper and lower eyelids.). Negative for color change.  Neurological:  Negative for seizures and syncope.  All other systems reviewed and are negative.    Physical Exam Triage Vital Signs ED Triage Vitals  Encounter Vitals Group     BP 04/03/24 1552 (!) 179/109     Girls Systolic BP Percentile --      Girls Diastolic BP Percentile --      Boys Systolic BP Percentile --      Boys Diastolic BP Percentile --      Pulse Rate 04/03/24 1552 78     Resp 04/03/24 1552 18     Temp 04/03/24 1552 (!) 97.4 F (36.3 C)     Temp Source 04/03/24 1552 Oral     SpO2 04/03/24 1552 95 %     Weight --      Height --      Head Circumference --      Peak Flow --      Pain Score 04/03/24 1547 0     Pain Loc --      Pain Education --      Exclude from Growth Chart --    No data found.  Updated Vital Signs BP 136/82 (BP Location: Right Arm)   Pulse 78   Temp (!) 97.4 F (36.3 C) (Oral)   Resp 18   SpO2 95%   Visual Acuity Right Eye Distance:   Left Eye Distance:   Bilateral Distance:    Right Eye Near:   Left Eye Near:    Bilateral Near:     Physical Exam Vitals and nursing note reviewed.  Constitutional:      General: He is not in  acute distress.    Appearance: He is well-developed. He is not ill-appearing or  toxic-appearing.  HENT:     Head: Normocephalic and atraumatic.     Right Ear: Hearing, tympanic membrane, ear canal and external ear normal.     Left Ear: Hearing, tympanic membrane, ear canal and external ear normal.     Nose: No congestion or rhinorrhea.     Right Sinus: No maxillary sinus tenderness or frontal sinus tenderness.     Left Sinus: No maxillary sinus tenderness or frontal sinus tenderness.     Mouth/Throat:     Lips: Pink.     Mouth: Mucous membranes are moist.     Pharynx: Uvula midline. No oropharyngeal exudate or posterior oropharyngeal erythema.     Tonsils: No tonsillar exudate.  Eyes:     General:        Right eye: Discharge (Watery discharge) present. No foreign body or hordeolum.        Left eye: No foreign body, discharge or hordeolum.     Extraocular Movements:     Right eye: Normal extraocular motion and no nystagmus.     Left eye: Normal extraocular motion and no nystagmus.     Conjunctiva/sclera:     Right eye: Right conjunctiva is injected. Exudate (Watery discharge) present. No chemosis or hemorrhage.    Pupils: Pupils are equal, round, and reactive to light.     Comments: Erythema and edema of right upper and lower eyelids with some watery discharge from the right eye  Cardiovascular:     Rate and Rhythm: Normal rate and regular rhythm.     Heart sounds: S1 normal and S2 normal. No murmur heard. Pulmonary:     Effort: Pulmonary effort is normal. No respiratory distress.     Breath sounds: Normal breath sounds. No decreased breath sounds, wheezing, rhonchi or rales.  Abdominal:     General: Bowel sounds are normal.     Palpations: Abdomen is soft.     Tenderness: There is no abdominal tenderness.  Musculoskeletal:        General: No swelling.     Cervical back: Neck supple.  Lymphadenopathy:     Head:     Right side of head: No submental, submandibular, tonsillar, preauricular or posterior auricular adenopathy.     Left side of head: No  submental, submandibular, tonsillar, preauricular or posterior auricular adenopathy.     Cervical: No cervical adenopathy.     Right cervical: No superficial cervical adenopathy.    Left cervical: No superficial cervical adenopathy.  Skin:    General: Skin is warm and dry.     Capillary Refill: Capillary refill takes less than 2 seconds.     Findings: Rash present.      Neurological:     Mental Status: He is alert and oriented to person, place, and time.  Psychiatric:        Mood and Affect: Mood normal.      UC Treatments / Results  Labs (all labs ordered are listed, but only abnormal results are displayed) Labs Reviewed - No data to display  EKG   Radiology No results found.  Procedures Procedures (including critical care time)  Medications Ordered in UC Medications  triamcinolone  acetonide (KENALOG -40) injection 80 mg (80 mg Intramuscular Given 04/03/24 1637)    Initial Impression / Assessment and Plan / UC Course  I have reviewed the triage vital signs and the nursing notes.  Pertinent labs & imaging results that were  available during my care of the patient were reviewed by me and considered in my medical decision making (see chart for details).  Plan of Care: Contact dermatitis of right face right scalp and left arm: Kenalog  80 mg injection now.  Kenalog  is a steroid.  Advised about avoiding NSAIDs for 2 to 3 weeks but may use acetaminophen  if needed.  Encouraged cetirizine  10 mg nightly to help with the rash and itching.  Early conjunctivitis of right eye: Moxifloxacin  ophthalmic drops, 1 drop 3 times a day for 5 days.  Follow-up if symptoms do not improve, worsen or new symptoms occur.  I reviewed the plan of care with the patient and/or the patient's guardian.  The patient and/or guardian had time to ask questions and acknowledged that the questions were answered.  I provided instruction on symptoms or reasons to return here or to go to an ER, if  symptoms/condition did not improve, worsened or if new symptoms occurred.  Final Clinical Impressions(s) / UC Diagnoses   Final diagnoses:  Contact dermatitis, unspecified contact dermatitis type, unspecified trigger  Acute conjunctivitis of right eye, unspecified acute conjunctivitis type     Discharge Instructions      Contact dermatitis of right face, right scalp and left arm: Kenalog  80 mg injection now.  Kenalog  is a steroid.  Use cetirizine , 10 mg, nightly to help with the rash and any itching.  Conjunctivitis of right eye: Moxifloxacin  ophthalmic drops, 1 drop 3 times a day for 5 to 7 days.  Follow-up if symptoms do not improve, worsen or new symptoms occur.     ED Prescriptions     Medication Sig Dispense Auth. Provider   moxifloxacin  (VIGAMOX ) 0.5 % ophthalmic solution Place 1 drop into both eyes 3 (three) times daily. 3 mL Ival Domino, FNP      PDMP not reviewed this encounter.   Ival Domino, FNP 04/04/24 1137    Ival Domino, FNP 04/04/24 [phone]

## 2024-04-04 ENCOUNTER — Encounter (HOSPITAL_BASED_OUTPATIENT_CLINIC_OR_DEPARTMENT_OTHER): Payer: Self-pay | Admitting: Family Medicine

## 2024-04-04 ENCOUNTER — Telehealth (HOSPITAL_BASED_OUTPATIENT_CLINIC_OR_DEPARTMENT_OTHER): Payer: Self-pay | Admitting: Family Medicine

## 2024-04-04 MED ORDER — ATENOLOL 100 MG PO TABS: 100.0000 mg | ORAL_TABLET | Freq: Every day | ORAL | 0 refills | Status: AC

## 2024-04-04 NOTE — Telephone Encounter (Signed)
 Patient was seen on 04/03/2024 and had an elevated blood pressure but recheck blood pressure was improved.  I did do a manual recheck and I did use the large cuff.  Patient is out of his atenolol  and needed a refill and I missed that.  I have left a voicemail message with his wife to alert her that I have refilled his atenolol  for 30 days to [facility] in [place].  I tried to reach him but I did not get an answer and his voicemail has not been set up.  I did leave a voicemail message with the wife and also provided our phone number if he needed to call back for any reason.
